# Patient Record
Sex: Male | Born: 1955 | Race: Black or African American | Hispanic: No | Marital: Single | Smoking: Current every day smoker
Health system: Southern US, Community
[De-identification: ages and names within clinical notes are randomized; demographics above are authoritative.]

## PROBLEM LIST (undated history)

## (undated) DIAGNOSIS — E43 Unspecified severe protein-calorie malnutrition: Secondary | ICD-10-CM

## (undated) DIAGNOSIS — F209 Schizophrenia, unspecified: Secondary | ICD-10-CM

## (undated) DIAGNOSIS — R778 Other specified abnormalities of plasma proteins: Secondary | ICD-10-CM

## (undated) DIAGNOSIS — I272 Pulmonary hypertension, unspecified: Secondary | ICD-10-CM

## (undated) DIAGNOSIS — R7989 Other specified abnormal findings of blood chemistry: Secondary | ICD-10-CM

## (undated) DIAGNOSIS — W19XXXA Unspecified fall, initial encounter: Secondary | ICD-10-CM

## (undated) DIAGNOSIS — I4891 Unspecified atrial fibrillation: Secondary | ICD-10-CM

## (undated) DIAGNOSIS — E785 Hyperlipidemia, unspecified: Secondary | ICD-10-CM

## (undated) DIAGNOSIS — I519 Heart disease, unspecified: Secondary | ICD-10-CM

## (undated) DIAGNOSIS — I10 Essential (primary) hypertension: Secondary | ICD-10-CM

## (undated) DIAGNOSIS — N529 Male erectile dysfunction, unspecified: Secondary | ICD-10-CM

## (undated) DIAGNOSIS — R519 Headache, unspecified: Secondary | ICD-10-CM

## (undated) DIAGNOSIS — I5189 Other ill-defined heart diseases: Secondary | ICD-10-CM

## (undated) DIAGNOSIS — C61 Malignant neoplasm of prostate: Secondary | ICD-10-CM

## (undated) DIAGNOSIS — K219 Gastro-esophageal reflux disease without esophagitis: Secondary | ICD-10-CM

## (undated) DIAGNOSIS — N179 Acute kidney failure, unspecified: Secondary | ICD-10-CM

## (undated) DIAGNOSIS — R079 Chest pain, unspecified: Secondary | ICD-10-CM

## (undated) DIAGNOSIS — Y92009 Unspecified place in unspecified non-institutional (private) residence as the place of occurrence of the external cause: Secondary | ICD-10-CM

## (undated) DIAGNOSIS — S7291XA Unspecified fracture of right femur, initial encounter for closed fracture: Secondary | ICD-10-CM

## (undated) DIAGNOSIS — R51 Headache: Secondary | ICD-10-CM

## (undated) HISTORY — DX: Acute kidney failure, unspecified: N17.9

## (undated) HISTORY — DX: Schizophrenia, unspecified: F20.9

## (undated) HISTORY — DX: Hyperlipidemia, unspecified: E78.5

## (undated) HISTORY — DX: Unspecified fall, initial encounter: W19.XXXA

## (undated) HISTORY — DX: Other ill-defined heart diseases: I51.89

## (undated) HISTORY — DX: Pulmonary hypertension, unspecified: I27.20

## (undated) HISTORY — DX: Heart disease, unspecified: I51.9

## (undated) HISTORY — DX: Gastro-esophageal reflux disease without esophagitis: K21.9

## (undated) HISTORY — DX: Headache: R51

## (undated) HISTORY — DX: Unspecified fracture of right femur, initial encounter for closed fracture: S72.91XA

## (undated) HISTORY — DX: Unspecified atrial fibrillation: I48.91

## (undated) HISTORY — DX: Headache, unspecified: R51.9

## (undated) HISTORY — DX: Male erectile dysfunction, unspecified: N52.9

## (undated) HISTORY — DX: Other specified abnormal findings of blood chemistry: R79.89

## (undated) HISTORY — DX: Essential (primary) hypertension: I10

## (undated) HISTORY — DX: Other specified abnormalities of plasma proteins: R77.8

## (undated) HISTORY — DX: Chest pain, unspecified: R07.9

## (undated) HISTORY — DX: Unspecified severe protein-calorie malnutrition: E43

## (undated) HISTORY — DX: Unspecified place in unspecified non-institutional (private) residence as the place of occurrence of the external cause: Y92.009

## (undated) HISTORY — DX: Malignant neoplasm of prostate: C61

---

## 2007-11-30 ENCOUNTER — Ambulatory Visit: Payer: Self-pay | Admitting: Radiation Oncology

## 2007-12-22 ENCOUNTER — Ambulatory Visit: Payer: Self-pay | Admitting: Specialist

## 2007-12-23 ENCOUNTER — Ambulatory Visit: Payer: Self-pay | Admitting: Specialist

## 2007-12-30 ENCOUNTER — Ambulatory Visit: Payer: Self-pay | Admitting: Radiation Oncology

## 2008-01-11 ENCOUNTER — Ambulatory Visit: Payer: Self-pay | Admitting: Radiation Oncology

## 2008-01-30 ENCOUNTER — Ambulatory Visit: Payer: Self-pay | Admitting: Radiation Oncology

## 2008-02-19 ENCOUNTER — Ambulatory Visit: Payer: Self-pay | Admitting: Radiation Oncology

## 2008-03-01 ENCOUNTER — Ambulatory Visit: Payer: Self-pay | Admitting: Radiation Oncology

## 2008-03-31 ENCOUNTER — Ambulatory Visit: Payer: Self-pay | Admitting: Radiation Oncology

## 2008-05-01 ENCOUNTER — Ambulatory Visit: Payer: Self-pay | Admitting: Radiation Oncology

## 2009-01-05 ENCOUNTER — Emergency Department: Payer: Self-pay | Admitting: Emergency Medicine

## 2009-03-01 ENCOUNTER — Ambulatory Visit: Payer: Self-pay | Admitting: Internal Medicine

## 2009-03-29 ENCOUNTER — Ambulatory Visit: Payer: Self-pay | Admitting: Radiation Oncology

## 2009-03-31 ENCOUNTER — Ambulatory Visit: Payer: Self-pay | Admitting: Internal Medicine

## 2009-03-31 ENCOUNTER — Ambulatory Visit: Payer: Self-pay | Admitting: Radiation Oncology

## 2009-09-29 ENCOUNTER — Ambulatory Visit: Payer: Self-pay | Admitting: Radiation Oncology

## 2009-10-02 ENCOUNTER — Ambulatory Visit: Payer: Self-pay | Admitting: Radiation Oncology

## 2009-10-29 ENCOUNTER — Ambulatory Visit: Payer: Self-pay | Admitting: Radiation Oncology

## 2010-03-27 ENCOUNTER — Ambulatory Visit: Payer: Self-pay | Admitting: Family Medicine

## 2010-03-31 ENCOUNTER — Ambulatory Visit: Payer: Self-pay | Admitting: Radiation Oncology

## 2010-04-04 ENCOUNTER — Ambulatory Visit: Payer: Self-pay | Admitting: Radiation Oncology

## 2010-05-01 ENCOUNTER — Ambulatory Visit: Payer: Self-pay | Admitting: Radiation Oncology

## 2011-04-18 ENCOUNTER — Ambulatory Visit: Payer: Self-pay | Admitting: Radiation Oncology

## 2011-04-19 LAB — PSA: PSA: 2.5 ng/mL (ref 0.0–4.0)

## 2011-05-02 ENCOUNTER — Ambulatory Visit: Payer: Self-pay | Admitting: Radiation Oncology

## 2012-04-14 ENCOUNTER — Ambulatory Visit: Payer: Self-pay | Admitting: Radiation Oncology

## 2012-04-15 LAB — PSA: PSA: 4.5 ng/mL — ABNORMAL HIGH (ref 0.0–4.0)

## 2012-05-01 ENCOUNTER — Ambulatory Visit: Payer: Self-pay | Admitting: Radiation Oncology

## 2012-07-03 ENCOUNTER — Ambulatory Visit: Payer: Self-pay | Admitting: Family Medicine

## 2012-10-12 ENCOUNTER — Ambulatory Visit: Payer: Self-pay | Admitting: Radiation Oncology

## 2012-10-14 LAB — PSA: PSA: 2.7 ng/mL (ref 0.0–4.0)

## 2012-10-29 ENCOUNTER — Ambulatory Visit: Payer: Self-pay | Admitting: Radiation Oncology

## 2012-12-08 ENCOUNTER — Ambulatory Visit: Payer: Self-pay | Admitting: Family Medicine

## 2013-04-13 ENCOUNTER — Ambulatory Visit: Payer: Self-pay | Admitting: Radiation Oncology

## 2013-04-14 LAB — PSA: PSA: 4 ng/mL (ref 0.0–4.0)

## 2013-05-01 ENCOUNTER — Ambulatory Visit: Payer: Self-pay | Admitting: Radiation Oncology

## 2013-05-24 ENCOUNTER — Encounter: Payer: Self-pay | Admitting: Podiatry

## 2013-05-26 ENCOUNTER — Ambulatory Visit (INDEPENDENT_AMBULATORY_CARE_PROVIDER_SITE_OTHER): Payer: Medicaid Other | Admitting: Podiatry

## 2013-05-26 ENCOUNTER — Encounter: Payer: Self-pay | Admitting: Podiatry

## 2013-05-26 VITALS — BP 155/90 | HR 77 | Resp 16

## 2013-05-26 DIAGNOSIS — B351 Tinea unguium: Secondary | ICD-10-CM

## 2013-05-26 DIAGNOSIS — M79609 Pain in unspecified limb: Secondary | ICD-10-CM

## 2013-05-26 NOTE — Progress Notes (Signed)
Christopher Martinez presents today with a chief complaint of painful toenails one through 5 bilateral.  Objective: Nails are thick yellow dystrophic clinically mycotic painful 1 through 5 bilateral. Vascular evaluation demonstrates strong palpable pulses bilateral.  Assessment: Pain in limb secondary to onychomycosis 1 through 5 bilateral.  Plan: Debridement of nails in thickness and length as a covered service secondary to pain.

## 2013-08-25 ENCOUNTER — Ambulatory Visit (INDEPENDENT_AMBULATORY_CARE_PROVIDER_SITE_OTHER): Payer: Medicaid Other | Admitting: Podiatry

## 2013-08-25 VITALS — BP 136/104 | HR 140 | Resp 16 | Ht 64.0 in

## 2013-08-25 DIAGNOSIS — B351 Tinea unguium: Secondary | ICD-10-CM

## 2013-08-25 DIAGNOSIS — M79609 Pain in unspecified limb: Secondary | ICD-10-CM

## 2013-08-26 NOTE — Progress Notes (Signed)
Christopher Martinez presents today with a chief complaint of painfully elongated toenails.  Objective: Vital signs are stable alert oriented x3. Pulses are palpable bilateral. Nails are thick yellow dystrophic clinically mycotic and painful palpation.  Assessment: Pain in limb secondary to onychomycosis 1 through 5 bilateral.  Plan: Treatment of nails 1 through 5 bilateral is a covered service secondary to pain.

## 2013-10-12 ENCOUNTER — Ambulatory Visit: Payer: Self-pay | Admitting: Radiation Oncology

## 2013-10-14 LAB — PSA: PSA: 4.5 ng/mL — ABNORMAL HIGH (ref 0.0–4.0)

## 2013-10-29 ENCOUNTER — Ambulatory Visit: Payer: Self-pay | Admitting: Radiation Oncology

## 2013-11-16 ENCOUNTER — Ambulatory Visit: Payer: Self-pay | Admitting: Family Medicine

## 2013-11-17 ENCOUNTER — Ambulatory Visit: Payer: Medicaid Other | Admitting: Podiatry

## 2013-12-30 ENCOUNTER — Ambulatory Visit: Payer: Medicaid Other | Admitting: Podiatrist

## 2013-12-30 VITALS — BP 128/82 | HR 143 | Resp 16

## 2013-12-30 DIAGNOSIS — B351 Tinea unguium: Secondary | ICD-10-CM | POA: Diagnosis not present

## 2013-12-30 DIAGNOSIS — M79673 Pain in unspecified foot: Secondary | ICD-10-CM

## 2013-12-30 DIAGNOSIS — M79609 Pain in unspecified limb: Secondary | ICD-10-CM

## 2013-12-30 NOTE — Progress Notes (Signed)
HPI:  Patient presents today for follow up of foot and nail care. Denies any new complaints today.  Objective:  Patients chart is reviewed.  Vascular status reveals pedal pulses noted at 2 out of 4 dp and pt bilateral .  Neurological sensation is Normal to [employer] monofilament bilateral.  Patients nails are thickened, discolored, distrophic, friable and brittle with yellow-brown discoloration. Patient subjectively relates they are painful with shoes and with ambulation of bilateral feet.  hpk's submet 1 and 5 are noted bilateral.  Left submet 5 is less callused and thin and was not debrided today.   Assessment:  Symptomatic onychomycosis  Plan:  Discussed treatment options and alternatives.  The symptomatic toenails  And calluses were debrided through manual an mechanical means without complication.  Return appointment recommended at routine intervals of 3 months    Trudie Buckler, DPM

## 2014-02-09 ENCOUNTER — Ambulatory Visit: Payer: Medicaid Other | Admitting: Podiatry

## 2014-02-15 DIAGNOSIS — J441 Chronic obstructive pulmonary disease with (acute) exacerbation: Secondary | ICD-10-CM | POA: Insufficient documentation

## 2014-02-15 DIAGNOSIS — I499 Cardiac arrhythmia, unspecified: Secondary | ICD-10-CM | POA: Insufficient documentation

## 2014-02-15 DIAGNOSIS — I4891 Unspecified atrial fibrillation: Secondary | ICD-10-CM | POA: Insufficient documentation

## 2014-02-15 DIAGNOSIS — I110 Hypertensive heart disease with heart failure: Secondary | ICD-10-CM | POA: Insufficient documentation

## 2014-02-15 DIAGNOSIS — I459 Conduction disorder, unspecified: Secondary | ICD-10-CM | POA: Insufficient documentation

## 2014-02-15 DIAGNOSIS — R9431 Abnormal electrocardiogram [ECG] [EKG]: Secondary | ICD-10-CM | POA: Insufficient documentation

## 2014-02-15 DIAGNOSIS — I130 Hypertensive heart and chronic kidney disease with heart failure and stage 1 through stage 4 chronic kidney disease, or unspecified chronic kidney disease: Secondary | ICD-10-CM | POA: Insufficient documentation

## 2014-04-12 ENCOUNTER — Ambulatory Visit: Payer: Self-pay | Admitting: Radiation Oncology

## 2014-04-13 ENCOUNTER — Ambulatory Visit: Payer: Medicaid Other | Admitting: Podiatry

## 2014-04-19 LAB — PSA: PSA: 5.5 ng/mL — ABNORMAL HIGH (ref 0.0–4.0)

## 2014-04-27 ENCOUNTER — Ambulatory Visit: Payer: Self-pay | Admitting: Internal Medicine

## 2014-05-01 ENCOUNTER — Ambulatory Visit: Payer: Self-pay | Admitting: Radiation Oncology

## 2014-05-03 ENCOUNTER — Ambulatory Visit (INDEPENDENT_AMBULATORY_CARE_PROVIDER_SITE_OTHER): Payer: Medicaid Other | Admitting: Podiatry

## 2014-05-03 DIAGNOSIS — Q828 Other specified congenital malformations of skin: Secondary | ICD-10-CM

## 2014-05-03 DIAGNOSIS — M79673 Pain in unspecified foot: Secondary | ICD-10-CM

## 2014-05-03 DIAGNOSIS — B351 Tinea unguium: Secondary | ICD-10-CM

## 2014-05-03 NOTE — Progress Notes (Signed)
Patient ID: Christopher Martinez, male   DOB: 1955/08/18, 58 y.o.   MRN: [id number]  Subjective: 58 year old male returns the office they for follow-up evaluation of painful elongated nails and symptomatically hyperkeratotic lesions bilateral feet. He denies any redness or drainage from the nails. Denies any acute changes his last appointment. No other complaints at this time. Denies any systemic complaints such as fevers, chills, nausea, vomiting.  Objective: AAO 3, NAD DP/PT pulses palpable bilaterally, CRT less than 3 seconds Protective sensation intact with [employer] monofilament Nails hypertrophic, dystrophic, elongated, brittle, yellow-brown discoloration 10. No surrounding erythema or drainage. Hyperkeratotic lesion submetatarsal one and five bilaterally.  No calf pain with compression, swelling, warmth, erythema No open lesions  Assessment: 58 year old male with symptoms, onychomycosis, painful  Porokeratosis.  Plan: -Treatment options discussed including alternatives, risks, competitions. -Nail sharply debrided 10 without competitions -Hyperkeratotic lesion sharply debrided 4 without complications. -Discussed the importance of daily foot inspection -Follow-up in 3 months or sooner if any problems are to arise. In the meantime call the office with any questions, concerns, change in symptoms.

## 2014-05-04 ENCOUNTER — Ambulatory Visit: Payer: Medicaid Other | Admitting: Podiatry

## 2014-07-26 ENCOUNTER — Inpatient Hospital Stay: Payer: Self-pay | Admitting: Internal Medicine

## 2014-07-26 DIAGNOSIS — I340 Nonrheumatic mitral (valve) insufficiency: Secondary | ICD-10-CM

## 2014-07-26 LAB — URINALYSIS, COMPLETE
Bilirubin,UR: NEGATIVE
Glucose,UR: NEGATIVE mg/dL (ref 0–75)
Hyaline Cast: 2
KETONE: NEGATIVE
Nitrite: POSITIVE
PH: 5 (ref 4.5–8.0)
RBC,UR: 4 /HPF (ref 0–5)
Specific Gravity: 1.017 (ref 1.003–1.030)
Squamous Epithelial: <1
WBC UR: 36 /HPF (ref 0–5)

## 2014-07-26 LAB — COMPREHENSIVE METABOLIC PANEL
ALBUMIN: 3.5 g/dL (ref 3.4–5.0)
ALT: 51 U/L (ref 14–63)
Alkaline Phosphatase: 99 U/L (ref 46–116)
Anion Gap: 13 (ref 7–16)
BUN: 53 mg/dL — AB (ref 7–18)
Bilirubin,Total: 0.8 mg/dL (ref 0.2–1.0)
Calcium, Total: 9.1 mg/dL (ref 8.5–10.1)
Chloride: 103 mmol/L (ref 98–107)
Co2: 24 mmol/L (ref 21–32)
Creatinine: 1.57 mg/dL — ABNORMAL HIGH (ref 0.60–1.30)
EGFR (African American): 58 — ABNORMAL LOW
EGFR (Non-African Amer.): 48 — ABNORMAL LOW
GLUCOSE: 78 mg/dL (ref 65–99)
Osmolality: 293 (ref 275–301)
Potassium: 4.3 mmol/L (ref 3.5–5.1)
SGOT(AST): 39 U/L — ABNORMAL HIGH (ref 15–37)
Sodium: 140 mmol/L (ref 136–145)
Total Protein: 8.5 g/dL — ABNORMAL HIGH (ref 6.4–8.2)

## 2014-07-26 LAB — CBC WITH DIFFERENTIAL/PLATELET
BASOS PCT: 0.5 %
Basophil #: 0.1 10*3/uL (ref 0.0–0.1)
EOS PCT: 0.5 %
Eosinophil #: 0.1 10*3/uL (ref 0.0–0.7)
HCT: 42.2 % (ref 40.0–52.0)
HGB: 13.7 g/dL (ref 13.0–18.0)
LYMPHS ABS: 2.5 10*3/uL (ref 1.0–3.6)
Lymphocyte %: 18.7 %
MCH: 30.8 pg (ref 26.0–34.0)
MCHC: 32.5 g/dL (ref 32.0–36.0)
MCV: 95 fL (ref 80–100)
MONO ABS: 0.5 x10 3/mm (ref 0.2–1.0)
Monocyte %: 4.2 %
NEUTROS ABS: 10 10*3/uL — AB (ref 1.4–6.5)
Neutrophil %: 76.1 %
Platelet: 95 10*3/uL — ABNORMAL LOW (ref 150–440)
RBC: 4.45 10*6/uL (ref 4.40–5.90)
RDW: 13.5 % (ref 11.5–14.5)
WBC: 13.1 10*3/uL — ABNORMAL HIGH (ref 3.8–10.6)

## 2014-07-26 LAB — PROTIME-INR
INR: 1
Prothrombin Time: 13.5 secs (ref 11.5–14.7)

## 2014-07-26 LAB — TROPONIN I
TROPONIN-I: 0.19 ng/mL — AB
Troponin-I: 0.23 ng/mL — ABNORMAL HIGH
Troponin-I: 0.18 ng/mL — ABNORMAL HIGH

## 2014-07-26 LAB — LIPASE, BLOOD: Lipase: 137 U/L (ref 73–393)

## 2014-07-26 LAB — MAGNESIUM: Magnesium: 2.6 mg/dL — ABNORMAL HIGH

## 2014-07-26 LAB — CK TOTAL AND CKMB (NOT AT ARMC)
CK, Total: 335 U/L — ABNORMAL HIGH (ref 39–308)
CK-MB: 12.6 ng/mL — AB (ref 0.5–3.6)

## 2014-07-26 LAB — APTT: Activated PTT: 35.1 secs (ref 23.6–35.9)

## 2014-07-27 LAB — BASIC METABOLIC PANEL
Anion Gap: 11 (ref 7–16)
BUN: 48 mg/dL — ABNORMAL HIGH (ref 7–18)
Calcium, Total: 7.8 mg/dL — ABNORMAL LOW (ref 8.5–10.1)
Chloride: 110 mmol/L — ABNORMAL HIGH (ref 98–107)
Co2: 23 mmol/L (ref 21–32)
Creatinine: 1.59 mg/dL — ABNORMAL HIGH (ref 0.60–1.30)
EGFR (African American): 57 — ABNORMAL LOW
EGFR (Non-African Amer.): 47 — ABNORMAL LOW
GLUCOSE: 94 mg/dL (ref 65–99)
Osmolality: 299 (ref 275–301)
POTASSIUM: 3.5 mmol/L (ref 3.5–5.1)
Sodium: 144 mmol/L (ref 136–145)

## 2014-07-27 LAB — CBC WITH DIFFERENTIAL/PLATELET
Basophil #: 0 10*3/uL (ref 0.0–0.1)
Basophil %: 0.2 %
EOS PCT: 1.7 %
Eosinophil #: 0.2 10*3/uL (ref 0.0–0.7)
HCT: 33.4 % — ABNORMAL LOW (ref 40.0–52.0)
HGB: 10.7 g/dL — AB (ref 13.0–18.0)
LYMPHS PCT: 16.3 %
Lymphocyte #: 2.2 10*3/uL (ref 1.0–3.6)
MCH: 30.5 pg (ref 26.0–34.0)
MCHC: 31.9 g/dL — ABNORMAL LOW (ref 32.0–36.0)
MCV: 95 fL (ref 80–100)
MONO ABS: 0.8 x10 3/mm (ref 0.2–1.0)
Monocyte %: 5.7 %
Neutrophil #: 10.3 10*3/uL — ABNORMAL HIGH (ref 1.4–6.5)
Neutrophil %: 76.1 %
PLATELETS: 76 10*3/uL — AB (ref 150–440)
RBC: 3.5 10*6/uL — ABNORMAL LOW (ref 4.40–5.90)
RDW: 13.8 % (ref 11.5–14.5)
WBC: 13.5 10*3/uL — AB (ref 3.8–10.6)

## 2014-07-28 LAB — CBC WITH DIFFERENTIAL/PLATELET
Basophil #: 0 10*3/uL (ref 0.0–0.1)
Basophil %: 0.2 %
EOS ABS: 0.4 10*3/uL (ref 0.0–0.7)
Eosinophil %: 3.9 %
HCT: 33.3 % — AB (ref 40.0–52.0)
HGB: 10.9 g/dL — ABNORMAL LOW (ref 13.0–18.0)
LYMPHS PCT: 22.2 %
Lymphocyte #: 2.1 10*3/uL (ref 1.0–3.6)
MCH: 31.3 pg (ref 26.0–34.0)
MCHC: 32.6 g/dL (ref 32.0–36.0)
MCV: 96 fL (ref 80–100)
Monocyte #: 0.7 x10 3/mm (ref 0.2–1.0)
Monocyte %: 7 %
NEUTROS ABS: 6.4 10*3/uL (ref 1.4–6.5)
Neutrophil %: 66.7 %
Platelet: 95 10*3/uL — ABNORMAL LOW (ref 150–440)
RBC: 3.47 10*6/uL — ABNORMAL LOW (ref 4.40–5.90)
RDW: 13.8 % (ref 11.5–14.5)
WBC: 9.6 10*3/uL (ref 3.8–10.6)

## 2014-07-28 LAB — BASIC METABOLIC PANEL
Anion Gap: 9 (ref 7–16)
BUN: 37 mg/dL — ABNORMAL HIGH (ref 7–18)
CO2: 22 mmol/L (ref 21–32)
Calcium, Total: 8.2 mg/dL — ABNORMAL LOW (ref 8.5–10.1)
Chloride: 111 mmol/L — ABNORMAL HIGH (ref 98–107)
Creatinine: 1.5 mg/dL — ABNORMAL HIGH (ref 0.60–1.30)
EGFR (African American): >60
EGFR (Non-African Amer.): 51 — ABNORMAL LOW
GLUCOSE: 87 mg/dL (ref 65–99)
Osmolality: 291 (ref 275–301)
Potassium: 3.8 mmol/L (ref 3.5–5.1)
SODIUM: 142 mmol/L (ref 136–145)

## 2014-07-29 HISTORY — PX: OTHER SURGICAL HISTORY: SHX169

## 2014-07-29 LAB — BASIC METABOLIC PANEL
Anion Gap: 7 (ref 7–16)
BUN: 30 mg/dL — AB (ref 7–18)
CALCIUM: 8.4 mg/dL — AB (ref 8.5–10.1)
CHLORIDE: 111 mmol/L — AB (ref 98–107)
Co2: 25 mmol/L (ref 21–32)
Creatinine: 1.42 mg/dL — ABNORMAL HIGH (ref 0.60–1.30)
EGFR (African American): >60
EGFR (Non-African Amer.): 54 — ABNORMAL LOW
GLUCOSE: 99 mg/dL (ref 65–99)
OSMOLALITY: 291 (ref 275–301)
POTASSIUM: 4.2 mmol/L (ref 3.5–5.1)
Sodium: 143 mmol/L (ref 136–145)

## 2014-07-30 LAB — BASIC METABOLIC PANEL
ANION GAP: 5 — AB (ref 7–16)
BUN: 21 mg/dL — ABNORMAL HIGH (ref 7–18)
CO2: 27 mmol/L (ref 21–32)
CREATININE: 1.26 mg/dL (ref 0.60–1.30)
Calcium, Total: 8.4 mg/dL — ABNORMAL LOW (ref 8.5–10.1)
Chloride: 107 mmol/L (ref 98–107)
EGFR (Non-African Amer.): >60
Glucose: 99 mg/dL (ref 65–99)
OSMOLALITY: 281 (ref 275–301)
Potassium: 4.7 mmol/L (ref 3.5–5.1)
Sodium: 139 mmol/L (ref 136–145)

## 2014-07-30 LAB — HEMOGLOBIN: HGB: 9.7 g/dL — AB (ref 13.0–18.0)

## 2014-07-30 LAB — PLATELET COUNT: Platelet: 137 10*3/uL — ABNORMAL LOW (ref 150–440)

## 2014-07-31 LAB — HEMOGLOBIN: HGB: 9.5 g/dL — AB (ref 13.0–18.0)

## 2014-08-01 LAB — CLOSTRIDIUM DIFFICILE(ARMC)

## 2014-08-02 ENCOUNTER — Ambulatory Visit: Payer: Medicaid Other | Admitting: Podiatry

## 2014-08-05 ENCOUNTER — Encounter: Payer: Self-pay | Admitting: Adult Health

## 2014-08-05 ENCOUNTER — Non-Acute Institutional Stay (SKILLED_NURSING_FACILITY): Payer: Medicaid Other | Admitting: Adult Health

## 2014-08-05 DIAGNOSIS — E43 Unspecified severe protein-calorie malnutrition: Secondary | ICD-10-CM | POA: Insufficient documentation

## 2014-08-05 DIAGNOSIS — I480 Paroxysmal atrial fibrillation: Secondary | ICD-10-CM

## 2014-08-05 DIAGNOSIS — I4891 Unspecified atrial fibrillation: Secondary | ICD-10-CM | POA: Insufficient documentation

## 2014-08-05 DIAGNOSIS — K219 Gastro-esophageal reflux disease without esophagitis: Secondary | ICD-10-CM | POA: Insufficient documentation

## 2014-08-05 DIAGNOSIS — S72401A Unspecified fracture of lower end of right femur, initial encounter for closed fracture: Secondary | ICD-10-CM | POA: Insufficient documentation

## 2014-08-05 DIAGNOSIS — S72401S Unspecified fracture of lower end of right femur, sequela: Secondary | ICD-10-CM

## 2014-08-05 MED ORDER — METOPROLOL TARTRATE 50 MG PO TABS: 75.0000 mg | ORAL_TABLET | Freq: Two times a day (BID) | ORAL | Status: DC

## 2014-08-05 NOTE — Progress Notes (Addendum)
Patient ID: Christopher Martinez, male   DOB: 22-May-1956, 59 y.o.   MRN: [id number]  Christopher Martinez living [facility]     No Known Allergies     Chief Complaint  Patient presents with  . Hospitalization Follow-up    HPI:  He has been hospitalized after a fall at home; afterward he lay on the floor for several days before he was found by his Education officer, museum. He has a diagnosis of schizophrenia; however; per his psychological consult he has never been treated for this disease and denies having it. He has not been declared incompetent. He did suffer a right femoral fracture had an orif of the right femur. He is here for short term rehab. He states his goal is to return back home.    Past Medical History  Diagnosis Date  . Frequent headaches   . Chest pain   . Fall at home   . Femur fracture, right   . Hypertension   . Elevated troponin   . GERD (gastroesophageal reflux disease)   . Acute renal failure   . Mild left ventricular systolic dysfunction   . Pulmonary hypertension   . A-fib   . Prostate cancer   . Schizophrenia   . Hyperlipidemia   . Severe protein-calorie malnutrition     Past Surgical History  Procedure Laterality Date  . Orif right femur Right 07-29-2014    VITAL SIGNS BP 121/82 mmHg  Pulse 120  Ht 5\' 6"  (1.676 m)  Wt 103 lb (46.72 kg)  BMI 16.63 kg/m2   Outpatient Encounter Prescriptions as of 08/05/2014  Medication Sig  . aspirin 81 MG tablet Take 81 mg by mouth daily.  [name] diltiazem (DILACOR XR) 240 MG 24 hr capsule Take 240 mg by mouth daily.  [name] enoxaparin (LOVENOX) 40 MG/0.4ML injection Inject 40 mg into the skin daily.  . magnesium hydroxide (MILK OF MAGNESIA) 800 MG/5ML suspension Take 30 mLs by mouth every 6 (six) hours as needed for constipation.  . metoprolol (LOPRESSOR) 50 MG tablet Take 50 mg by mouth 2 (two) times daily.   Oxycodone 5 mg  5 mg or 10 mg every 4 hours as needed   . ranitidine (ZANTAC) 150 MG tablet Take 150 mg by mouth 2 (two) times daily.      SIGNIFICANT DIAGNOSTIC EXAMS  07-26-14: right knee x-ray: comminuted displaced intra-articular distal right femoral metaphyseal fracture.  07-26-14: chest x-ray: no acute cardiopulmonary disease   07-29-14: right knee: status post intraoperative repair of distal right femoral fracture. A lateral metallic fixation place and screws are noted in distal right femur. There is improvement in alignment    LABS REVIEWED:   07-26-14: wbc 13.1; hgb 13.7; hct 42.2; plt 95; glucose75; bun 53; creat 1.5; k+4.3; na++140; mag 2.6; liver normal ck 335; ck-mb 12.6; troponin 0.23.     Review of Systems  Constitutional: Negative for weight loss and malaise/fatigue.  Respiratory: Negative for cough and shortness of breath.   Cardiovascular: Negative for chest pain, palpitations and leg swelling.  Gastrointestinal: Negative for heartburn, abdominal pain and constipation.  Genitourinary: Negative for dysuria, frequency and hematuria.  Musculoskeletal: Positive for joint pain. Negative for myalgias.       Has right leg pain due to fracture; states is getting adequate pain relief.   Skin: Negative.   Neurological: Negative for headaches.  Psychiatric/Behavioral: Negative for depression. The patient is not nervous/anxious.      Physical Exam  Constitutional: He is oriented to person, place, and time. No  distress.  Frail   Neck: Neck supple. No JVD present. No thyromegaly present.  Cardiovascular: Regular rhythm, normal heart sounds and intact distal pulses.   Is tachycardic   Respiratory: Effort normal and breath sounds normal. No respiratory distress.  GI: Soft. Bowel sounds are normal. He exhibits no distension. There is no tenderness.  Genitourinary:  Urine is "white" in color states urine has been this color for a long time   Musculoskeletal: He exhibits no edema.  Is able to move all extremities is status post right femoral fracture.   Neurological: He is alert and oriented to person,  place, and time.  Skin: Skin is warm and dry. He is not diaphoretic.  Incision lines without signs of infection present.        ASSESSMENT/ PLAN:  1.  Right femoral fracture; will follow up with orthopedics as indicated; will continue with therapy as directed; will continue lovenox for a total of 14 days; will continue oxycodone 5 or 10 mg every 4 hours as needed for pain and will monitor his status.   2. Afib; his heart rate remains tachycardic; will continue asa 81 mg daily; will increase his lopressor to 75 mg twice daily will continue diltiazem xr 240 mg daily and will monitor his vital signs every shift will monitor his status  3. Gerd: will continue zantac 150 mg twice daily   4. Malnutrition: will continue supplements per facility protocol and will begin mvi daily will monitor his weight.   Will check cbc; cmp ua/c&s   Time spent 50 minutes with patient     Ok Edwards NP [facility]  Contact [phone] Monday through Friday 8am- 5pm  After hours call [phone]

## 2014-08-06 LAB — BASIC METABOLIC PANEL
BUN: 16 mg/dL (ref 4–21)
Creatinine: 1.3 mg/dL (ref 0.6–1.3)
Glucose: 103 mg/dL
Potassium: 5.2 mmol/L (ref 3.4–5.3)
SODIUM: 136 mmol/L — AB (ref 137–147)

## 2014-08-09 ENCOUNTER — Non-Acute Institutional Stay (SKILLED_NURSING_FACILITY): Payer: Medicaid Other | Admitting: Internal Medicine

## 2014-08-09 ENCOUNTER — Encounter: Payer: Self-pay | Admitting: Internal Medicine

## 2014-08-09 DIAGNOSIS — I270 Primary pulmonary hypertension: Secondary | ICD-10-CM

## 2014-08-09 DIAGNOSIS — F209 Schizophrenia, unspecified: Secondary | ICD-10-CM

## 2014-08-09 DIAGNOSIS — I272 Pulmonary hypertension, unspecified: Secondary | ICD-10-CM

## 2014-08-09 DIAGNOSIS — E43 Unspecified severe protein-calorie malnutrition: Secondary | ICD-10-CM

## 2014-08-09 DIAGNOSIS — I519 Heart disease, unspecified: Secondary | ICD-10-CM

## 2014-08-09 DIAGNOSIS — N179 Acute kidney failure, unspecified: Secondary | ICD-10-CM | POA: Insufficient documentation

## 2014-08-09 DIAGNOSIS — I5189 Other ill-defined heart diseases: Secondary | ICD-10-CM

## 2014-08-09 DIAGNOSIS — Z8546 Personal history of malignant neoplasm of prostate: Secondary | ICD-10-CM

## 2014-08-09 DIAGNOSIS — S72401S Unspecified fracture of lower end of right femur, sequela: Secondary | ICD-10-CM

## 2014-08-09 DIAGNOSIS — K219 Gastro-esophageal reflux disease without esophagitis: Secondary | ICD-10-CM

## 2014-08-09 DIAGNOSIS — I480 Paroxysmal atrial fibrillation: Secondary | ICD-10-CM

## 2014-08-09 DIAGNOSIS — I071 Rheumatic tricuspid insufficiency: Secondary | ICD-10-CM

## 2014-08-09 DIAGNOSIS — I10 Essential (primary) hypertension: Secondary | ICD-10-CM

## 2014-08-09 NOTE — Progress Notes (Signed)
Patient ID: Deral Schellenberg, male   DOB: 02/22/1956, 59 y.o.   MRN: [id number]    HISTORY AND PHYSICAL  Location:  [facility]    Place of Service: SNF [phone])   Extended Emergency Contact Information Primary Emergency Contact: [place] [facility] [place] Phone: [phone] Relation: None  Advanced Directive information   FULL CODE  Chief Complaint  Patient presents with  . New Admit To SNF    HPI:  59 yo male seen today as a new admission into SNF for above.  Past Medical History  Diagnosis Date  . Frequent headaches   . Chest pain   . Fall at home   . Femur fracture, right   . Hypertension   . Elevated troponin   . GERD (gastroesophageal reflux disease)   . Acute renal failure   . Mild left ventricular systolic dysfunction   . Pulmonary hypertension   . A-fib   . Prostate cancer   . Schizophrenia   . Hyperlipidemia   . Severe protein-calorie malnutrition     Past Surgical History  Procedure Laterality Date  . Orif right femur Right 07-29-2014    Patient Care Team: Ashok Norris, MD as PCP - General (Family Medicine)  History   Social History  . Marital Status: Single    Spouse Name: N/A    Number of Children: N/A  . Years of Education: N/A   Occupational History  . Not on file.   Social History Main Topics  . Smoking status: Current Every Day Smoker -- 1.00 packs/day    Types: Cigarettes  . Smokeless tobacco: Not on file  . Alcohol Use: No  . Drug Use: No  . Sexual Activity: Not on file   Other Topics Concern  . Not on file   Social History Narrative     reports that he has been smoking Cigarettes.  He has been smoking about 1.00 pack per day. He does not have any smokeless tobacco history on file. He reports that he does not drink alcohol or use illicit drugs.  No family history on file. Family Status  Relation Status Death Age  . Mother Deceased   . Father Deceased      There is no immunization  history on file for this patient.  No Known Allergies  Medications: Patient's Medications  New Prescriptions   No medications on file  Previous Medications   ASPIRIN 81 MG TABLET    Take 81 mg by mouth daily.   DILTIAZEM (DILACOR XR) 240 MG 24 HR CAPSULE    Take 240 mg by mouth daily.   ENOXAPARIN (LOVENOX) 40 MG/0.4ML INJECTION    Inject 40 mg into the skin daily.   MAGNESIUM HYDROXIDE (MILK OF MAGNESIA) 800 MG/5ML SUSPENSION    Take 30 mLs by mouth every 6 (six) hours as needed for constipation.   METOPROLOL (LOPRESSOR) 50 MG TABLET    Take 1.5 tablets (75 mg total) by mouth 2 (two) times daily.   RANITIDINE (ZANTAC) 150 MG TABLET    Take 150 mg by mouth 2 (two) times daily.  Modified Medications   No medications on file  Discontinued Medications   No medications on file    Review of Systems  Filed Vitals:   08/08/14 1556  BP: 117/90  Pulse: 100  Temp: 98.4 F (36.9 C)  SpO2: 98%   There is no weight on file to calculate BMI.  Physical Exam  CONSTITUTIONAL: Looks well in NAD. Awake,  alert and oriented x 3 HEENT: PERRLA. Oropharynx clear and without exudate NECK: Supple. Nontender. No palpable cervical or supraclavicular lymph nodes. No carotid bruit b/l. No thyromegaly or thyroid mass palpable.  CVS: irregular with 1/6 SEmurmur. No gallop or rub. LUNGS: CTA b/l no wheezing, rales or rhonchi. ABDOMEN: Bowel sounds present x 4. Soft, nontender, nondistended. No palpable mass or bruit EXTREMITIES: trace RLE edema and no LLE edema. Distal pulses palpable. No calf tenderness. Right lateral thigh bandages c/d/i. No redness or d/c. Min TTP PSYCH: Affect, behavior and mood normal  Labs reviewed: No results found for any previous visit.  No results found.   Assessment/Plan

## 2014-08-23 ENCOUNTER — Encounter: Payer: Self-pay | Admitting: Internal Medicine

## 2014-08-23 ENCOUNTER — Non-Acute Institutional Stay (SKILLED_NURSING_FACILITY): Payer: Medicaid Other | Admitting: Internal Medicine

## 2014-08-23 DIAGNOSIS — I480 Paroxysmal atrial fibrillation: Secondary | ICD-10-CM

## 2014-08-23 DIAGNOSIS — K219 Gastro-esophageal reflux disease without esophagitis: Secondary | ICD-10-CM

## 2014-08-23 DIAGNOSIS — I272 Pulmonary hypertension, unspecified: Secondary | ICD-10-CM

## 2014-08-23 DIAGNOSIS — E43 Unspecified severe protein-calorie malnutrition: Secondary | ICD-10-CM

## 2014-08-23 DIAGNOSIS — F209 Schizophrenia, unspecified: Secondary | ICD-10-CM

## 2014-08-23 DIAGNOSIS — I071 Rheumatic tricuspid insufficiency: Secondary | ICD-10-CM

## 2014-08-23 DIAGNOSIS — I270 Primary pulmonary hypertension: Secondary | ICD-10-CM

## 2014-08-23 DIAGNOSIS — I10 Essential (primary) hypertension: Secondary | ICD-10-CM

## 2014-08-23 DIAGNOSIS — S72401S Unspecified fracture of lower end of right femur, sequela: Secondary | ICD-10-CM

## 2014-08-23 NOTE — Progress Notes (Signed)
Patient ID: Christopher Martinez, male   DOB: [birth year], 59 y.o.   MRN: [id number]    HISTORY AND PHYSICAL  Location:       Place of Service:     Extended Emergency Contact Information Primary Emergency Contact: [facility] Address: [place], [place] [address] Home Phone: [phone] Relation: None Secondary Emergency Contact: [place] [facility] [place] Phone: [phone] Relation: None  Advanced Directive information    Chief Complaint  Patient presents with  . New Admit To SNF    afib w RVR, HTN, right femur fx s/p repair, ARF due to dehydration, GERD, moderate TR, mild systolic dysfunction, mild pulmonary HTN, schizophrenia, hx prostate CA    HPI:  59 yo male seen today as a new admission into SNF for above. He c/o sternal CP with intermittent palpitations, DOE on [place] O2. He is s/p right hip ORIF. He is c/a tingling "all over" at night. He has sleep disturbances. Pain is severe most times. No recent falls. He is tolerating PT  Past Medical History  Diagnosis Date  . Frequent headaches   . Chest pain   . Fall at home   . Femur fracture, right   . Hypertension   . Elevated troponin   . GERD (gastroesophageal reflux disease)   . Acute renal failure   . Mild left ventricular systolic dysfunction   . Pulmonary hypertension   . A-fib   . Prostate cancer   . Schizophrenia   . Hyperlipidemia   . Severe protein-calorie malnutrition     Past Surgical History  Procedure Laterality Date  . Orif right femur Right 07-29-2014    Patient Care Team: Ashok Norris, MD as PCP - General (Family Medicine) Ashok Norris, MD (Family Medicine)  History   Social History  . Marital Status: Single    Spouse Name: N/A  . Number of Children: N/A  . Years of Education: N/A   Occupational History  . Not on file.   Social History Main Topics  . Smoking status: Current Every Day Smoker -- 1.00 packs/day    Types: Cigarettes  . Smokeless tobacco:  Not on file  . Alcohol Use: No  . Drug Use: No  . Sexual Activity: Not on file   Other Topics Concern  . Not on file   Social History Narrative     reports that he has been smoking Cigarettes.  He has been smoking about 1.00 pack per day. He does not have any smokeless tobacco history on file. He reports that he does not drink alcohol or use illicit drugs.  No family history on file. Family Status  Relation Status Death Age  . Mother Deceased   . Father Deceased      There is no immunization history on file for this patient.  Past medical, surgical, family and social history reviewed  No Known Allergies  Medications: Patient's Medications  New Prescriptions   No medications on file  Previous Medications   ASPIRIN 81 MG TABLET    Take 81 mg by mouth daily.   DILTIAZEM (DILACOR XR) 240 MG 24 HR CAPSULE    Take 240 mg by mouth daily.   ENOXAPARIN (LOVENOX) 40 MG/0.4ML INJECTION    Inject 40 mg into the skin daily.   MAGNESIUM HYDROXIDE (MILK OF MAGNESIA) 800 MG/5ML SUSPENSION    Take 30 mLs by mouth every 6 (six) hours as needed for constipation.   METOPROLOL (LOPRESSOR) 50  MG TABLET    Take 1.5 tablets (75 mg total) by mouth 2 (two) times daily.   NICOTINE (NICODERM CQ - DOSED IN MG/24 HOURS) 21 MG/24HR PATCH    Place 21 mg onto the skin daily.   OXYCODONE (OXY IR/ROXICODONE) 5 MG IMMEDIATE RELEASE TABLET    Take 5 mg by mouth every 4 (four) hours as needed for severe pain (Give 1 tab q4hrs prn moderate pain. May take 2 tabs q4h prn severe pain).   RANITIDINE (ZANTAC) 150 MG TABLET    Take 150 mg by mouth 2 (two) times daily.  Modified Medications   No medications on file  Discontinued Medications   No medications on file    Review of Systems As above. All other systems reviewed are negative  Filed Vitals:   08/08/14 1157  BP: 117/90  Pulse: 100  Temp: 98.4 F (36.9 C)  SpO2: 98%   There is no weight on file to calculate BMI.  Physical Exam  CONSTITUTIONAL:  Looks well in NAD. Awake, alert and oriented x 3 HEENT: PERRLA. Oropharynx clear and without exudate. MMM NECK: Supple. Nontender. No palpable cervical or supraclavicular lymph nodes. No carotid bruit b/l. No thyromegaly or thyroid mass palpable.  CVS: Irregular irregular with 1/6 SE murmur. No gallop or rub. LUNGS: CTA b/l no wheezing, rales or rhonchi. ABDOMEN: Bowel sounds present x 4. Soft, nontender, nondistended. No palpable mass or bruit EXTREMITIES: trace RLE edema with c/d/i lateral thigh and knee bandages. No LLE edema. Distal pulses palpable. No calf tenderness PSYCH: Affect, behavior and mood normal  Labs reviewed: No results found for any previous visit.  No results found.   Chart reviewed  Assessment/Plan   ICD-9-CM ICD-10-CM   1. Paroxysmal atrial fibrillation - rate controlled 427.31 I48.0   2. Closed fracture of right distal femur, sequela s/p repair 905.4 S72.401S   3. Essential hypertension controlled 401.9 I10   4. Severe protein-calorie malnutrition 262 E43   5. Gastroesophageal reflux disease without esophagitis 530.81 K21.9   6. Schizophrenia, unspecified type - stable 295.90 F20.9   7. Moderate tricuspid regurgitation 397.0 I07.1   8. Mild pulmonary hypertension 416.8 I27.0   9. Tobacco abuse - use nicotine patch daily   --continue pain control  --PT/OT/ST as indicated  --continue nutritional supplement TID  --check CBC w diff and CMP if not done already  --continue other medications as ordered  --will follow  GOAL: short term rehab and d/c home when medically appropriate. Communicated with pt and nursing.   Whitley Strycharz S. Perlie Gold  [facility] [address] [place], [place] [address] [phone] Office (Wednesdays and Fridays 8 AM - 5 PM) [phone] Cell (Monday-Friday 8 AM - 5 PM)

## 2014-09-21 ENCOUNTER — Encounter: Payer: Self-pay | Admitting: Vascular Surgery

## 2014-09-27 ENCOUNTER — Encounter: Payer: Self-pay | Admitting: Adult Health

## 2014-09-27 ENCOUNTER — Non-Acute Institutional Stay (INDEPENDENT_AMBULATORY_CARE_PROVIDER_SITE_OTHER): Payer: Medicaid Other | Admitting: Adult Health

## 2014-09-27 ENCOUNTER — Encounter: Payer: Self-pay | Admitting: Vascular Surgery

## 2014-09-27 DIAGNOSIS — S72401S Unspecified fracture of lower end of right femur, sequela: Secondary | ICD-10-CM

## 2014-09-27 DIAGNOSIS — E43 Unspecified severe protein-calorie malnutrition: Secondary | ICD-10-CM

## 2014-09-27 DIAGNOSIS — I480 Paroxysmal atrial fibrillation: Secondary | ICD-10-CM | POA: Diagnosis not present

## 2014-09-27 DIAGNOSIS — I10 Essential (primary) hypertension: Secondary | ICD-10-CM | POA: Diagnosis not present

## 2014-09-27 DIAGNOSIS — F209 Schizophrenia, unspecified: Secondary | ICD-10-CM | POA: Diagnosis not present

## 2014-09-27 NOTE — Progress Notes (Signed)
Patient ID: Christopher Martinez, male   DOB: 1955/10/05, 59 y.o.   MRN: [id number]  [facility]     No Known Allergies     Chief Complaint  Patient presents with  . Discharge Note    HPI:  He is being discharged to assisted living. He will need a wheelchair in order to maintain his current level of independence with ad's which cannot be achieved with a walker due to his right femur fracture. He will need a 3:1 commode as well. He will need home health in order to improve upon his gait; balance; and adl retraining. He will need his prescriptions to be written and will need a follow up with his pcp.    Past Medical History  Diagnosis Date  . Frequent headaches   . Chest pain   . Fall at home   . Femur fracture, right   . Hypertension   . Elevated troponin   . GERD (gastroesophageal reflux disease)   . Acute renal failure   . Mild left ventricular systolic dysfunction   . Pulmonary hypertension   . A-fib   . Prostate cancer   . Schizophrenia   . Hyperlipidemia   . Severe protein-calorie malnutrition     Past Surgical History  Procedure Laterality Date  . Orif right femur Right 07-29-2014    VITAL SIGNS BP 132/88 mmHg  Pulse 76  Ht 5\' 3"  (1.6 m)  Wt 103 lb (46.72 kg)  BMI 18.25 kg/m2  SpO2 98%   Outpatient Encounter Prescriptions as of 09/27/2014  Medication Sig  . aspirin 81 MG tablet Take 81 mg by mouth daily.  [name] diltiazem (DILACOR XR) 240 MG 24 hr capsule Take 240 mg by mouth daily.  . magnesium hydroxide (MILK OF MAGNESIA) 800 MG/5ML suspension Take 30 mLs by mouth every 6 (six) hours as needed for constipation.  . metoprolol (LOPRESSOR) 50 MG tablet Take 1.5 tablets (75 mg total) by mouth 2 (two) times daily.   Ativan 0.5 mg  Take 0.5 mg every 6 hours as needed for anxiety   . nicotine (NICODERM CQ - DOSED IN MG/24 HOURS) 21 mg/24hr patch Place 21 mg onto the skin daily.  [name] oxyCODONE (OXY IR/ROXICODONE) 5 MG immediate release tablet Take 5 mg by mouth every 4 (four)  hours as needed for severe pain (Give 1 tab q4hrs prn moderate pain. May take 2 tabs q4h prn severe pain).  . ranitidine (ZANTAC) 150 MG tablet Take 150 mg by mouth 2 (two) times daily.     SIGNIFICANT DIAGNOSTIC EXAMS  07-26-14: right knee x-ray: comminuted displaced intra-articular distal right femoral metaphyseal fracture.  07-26-14: chest x-ray: no acute cardiopulmonary disease   07-29-14: right knee: status post intraoperative repair of distal right femoral fracture. A lateral metallic fixation place and screws are noted in distal right femur. There is improvement in alignment  08-25-14: bilateral arterial lower extremity scan: severe plaque. Right common femoral artery stenosis and infrapopliteal arterial stenotic disease in the mild to moderate ischemia range. No doppler evidence of hemodynamically significant stenosis in the left femoral arterial system.   09-18-14: bilateral lower extremity venous doppler: no evidence of dvt in bilateral lower extremities.   LABS REVIEWED:   07-26-14: wbc 13.1; hgb 13.7; hct 42.2; plt 95; glucose75; bun 53; creat 1.5; k+4.3; na++140; mag 2.6; liver normal ck 335; ck-mb 12.6; troponin 0.23.  08-06-14: glucose 103; bun 22; creat 1.33; k+5.2; na++136; liver normal albumin 3.1  09-03-14: glucose 121; bun 36.3; creat 1.32; k+4.6;  na++137     ROS Constitutional: Negative for weight loss and malaise/fatigue.  Respiratory: Negative for cough and shortness of breath.   Cardiovascular: Negative for chest pain, palpitations and leg swelling.  Gastrointestinal: Negative for heartburn, abdominal pain and constipation.  Genitourinary: Negative for dysuria, frequency and hematuria.  Musculoskeletal: negative for joint pain . Negative for myalgias.    Skin: Negative.   Neurological: Negative for headaches.  Psychiatric/Behavioral: Negative for depression. The patient is not nervous/anxious.      Physical Exam Constitutional: He is oriented to person, place, and  time. No distress.  Frail   Neck: Neck supple. No JVD present. No thyromegaly present.  Cardiovascular: Regular rhythm, normal heart sounds and intact distal pulses.   Is tachycardic   Respiratory: Effort normal and breath sounds normal. No respiratory distress.  GI: Soft. Bowel sounds are normal. He exhibits no distension. There is no tenderness.   Musculoskeletal: He exhibits no edema.  Is able to move all extremities is status post right femoral fracture.   Neurological: He is alert and oriented to person, place, and time.  Skin: Skin is warm and dry. He is not diaphoretic.       ASSESSMENT/ PLAN:  Will discharge him to assisted living  with home health for pt/ot. He will need a standard wheelchair; 3:1 commode. His prescriptions have been written for a 30 day supply of his medications with #30 ativan 0.5 mg tabs and #30 oxycodone 5 mg tabs. He has a follow up with his pcp at [facility] on 10-05-14 at 9:00 am and with [facility] on 10-11-14 at 9;00 am.   Time spent with patient 45 minutes.     Ok Edwards NP [facility]  Contact [phone] Monday through Friday 8am- 5pm  After hours call [phone]

## 2014-09-28 ENCOUNTER — Encounter: Payer: Medicaid Other | Admitting: Vascular Surgery

## 2014-09-28 ENCOUNTER — Encounter: Payer: Self-pay | Admitting: Internal Medicine

## 2014-09-28 ENCOUNTER — Non-Acute Institutional Stay (SKILLED_NURSING_FACILITY): Payer: Medicaid Other | Admitting: Internal Medicine

## 2014-09-28 DIAGNOSIS — I480 Paroxysmal atrial fibrillation: Secondary | ICD-10-CM | POA: Diagnosis not present

## 2014-09-28 DIAGNOSIS — I10 Essential (primary) hypertension: Secondary | ICD-10-CM

## 2014-09-28 DIAGNOSIS — E875 Hyperkalemia: Secondary | ICD-10-CM | POA: Diagnosis not present

## 2014-09-28 DIAGNOSIS — S72401S Unspecified fracture of lower end of right femur, sequela: Secondary | ICD-10-CM

## 2014-09-28 DIAGNOSIS — K219 Gastro-esophageal reflux disease without esophagitis: Secondary | ICD-10-CM

## 2014-09-28 DIAGNOSIS — M25561 Pain in right knee: Secondary | ICD-10-CM

## 2014-09-28 DIAGNOSIS — F209 Schizophrenia, unspecified: Secondary | ICD-10-CM | POA: Diagnosis not present

## 2014-09-28 NOTE — Progress Notes (Signed)
Patient ID: Christopher Martinez, male   DOB: August 24, 1955, 59 y.o.   MRN: [id number]    Facility  [facility]    Place of Service:   SNF   No Known Allergies  Chief Complaint  Patient presents with  . Medical Management of Chronic Issues    HPI:  59 yo male seen today at SNF for f/u. C/o not eating well. No CP but has SOB. He has right knee pain 10/10 on scale. He is s/p femur fx repair. Pain med is oxycodone 5-10mg  q4hrs prn. He is tolerating PT/OT. No f/c. No falls. No nursing issues.  He has schizophrenia but takes no medications.  GERD is stable on zantac  afib controlled on metoprolol and diltiazem ER. He takes ASA daily for anticoagulation  He is still smoking cigs. Currently has nicotine patch.   Past Medical History  Diagnosis Date  . Frequent headaches   . Chest pain   . Fall at home   . Femur fracture, right   . Hypertension   . Elevated troponin   . GERD (gastroesophageal reflux disease)   . Acute renal failure   . Mild left ventricular systolic dysfunction   . Pulmonary hypertension   . A-fib   . Prostate cancer   . Schizophrenia   . Hyperlipidemia   . Severe protein-calorie malnutrition    Past Surgical History  Procedure Laterality Date  . Orif right femur Right 07-29-2014   History   Social History  . Marital Status: Single    Spouse Name: N/A  . Number of Children: N/A  . Years of Education: N/A   Social History Main Topics  . Smoking status: Current Every Day Smoker -- 1.00 packs/day    Types: Cigarettes  . Smokeless tobacco: Not on file  . Alcohol Use: No  . Drug Use: No  . Sexual Activity: Not on file   Other Topics Concern  . None   Social History Narrative     Medications: Patient's Medications  New Prescriptions   No medications on file  Previous Medications   ASPIRIN 81 MG TABLET    Take 81 mg by mouth daily.   DILTIAZEM (DILACOR XR) 240 MG 24 HR CAPSULE    Take 240 mg by mouth daily.   MAGNESIUM HYDROXIDE (MILK OF  MAGNESIA) 800 MG/5ML SUSPENSION    Take 30 mLs by mouth every 6 (six) hours as needed for constipation.   METOPROLOL (LOPRESSOR) 50 MG TABLET    Take 1.5 tablets (75 mg total) by mouth 2 (two) times daily.   NICOTINE (NICODERM CQ - DOSED IN MG/24 HOURS) 21 MG/24HR PATCH    Place 21 mg onto the skin daily.   OXYCODONE (OXY IR/ROXICODONE) 5 MG IMMEDIATE RELEASE TABLET    Take 5 mg by mouth every 4 (four) hours as needed for severe pain (Give 1 tab q4hrs prn moderate pain. May take 2 tabs q4h prn severe pain).   RANITIDINE (ZANTAC) 150 MG TABLET    Take 150 mg by mouth 2 (two) times daily.  Modified Medications   No medications on file  Discontinued Medications   No medications on file     Review of Systems  Constitutional: Positive for appetite change. Negative for chills, activity change and fatigue.  HENT: Negative for sore throat and trouble swallowing.   Eyes: Negative for visual disturbance.  Respiratory: Positive for shortness of breath. Negative for cough and chest tightness.   Cardiovascular: Negative for chest pain, palpitations and leg  swelling.  Gastrointestinal: Negative for nausea, vomiting, abdominal pain and blood in stool.  Genitourinary: Negative for urgency, frequency and difficulty urinating.  Musculoskeletal: Positive for joint swelling, arthralgias and gait problem.  Skin: Negative for rash.  Neurological: Negative for weakness and headaches.  Psychiatric/Behavioral: Positive for agitation. Negative for hallucinations, confusion and sleep disturbance. The patient is not nervous/anxious.     Filed Vitals:   09/01/14 0004  BP: 114/62  Pulse: 65  Temp: 97.8 F (36.6 C)  SpO2: 95%   There is no weight on file to calculate BMI.  Physical Exam  Constitutional: He is oriented to person, place, and time. He appears well-developed and well-nourished.  HENT:  Mouth/Throat: Oropharynx is clear and moist.  Eyes: Pupils are equal, round, and reactive to light. No scleral  icterus.  Neck: Neck supple. No thyromegaly present.  Cardiovascular: Normal rate, regular rhythm, normal heart sounds and intact distal pulses.  Exam reveals no gallop and no friction rub.   No murmur heard. No carotid bruit b/l; no distal LE swelling  Pulmonary/Chest: Effort normal and breath sounds normal. He has no wheezes. He has no rales. He exhibits no tenderness.  Abdominal: Soft. Bowel sounds are normal. He exhibits no distension, no abdominal bruit, no pulsatile midline mass and no mass. There is no tenderness. There is no rebound and no guarding.  Musculoskeletal: He exhibits edema (right lateral knee swelling; min TTP; reduced ROM) and tenderness.  Lymphadenopathy:    He has no cervical adenopathy.  Neurological: He is alert and oriented to person, place, and time.  Skin: Skin is warm and dry. No rash noted.  Psychiatric: His speech is normal and behavior is normal. Judgment and thought content normal. His mood appears anxious.     Labs reviewed: Nursing Home on 09/28/2014  Component Date Value Ref Range Status  . Glucose 08/06/2014 103   Final  . BUN 08/06/2014 16  4 - 21 mg/dL Final  . Creatinine 08/06/2014 1.3  0.6 - 1.3 mg/dL Final  . Potassium 08/06/2014 5.2  3.4 - 5.3 mmol/L Final  . Sodium 08/06/2014 136* 137 - 147 mmol/L Final     Assessment/Plan   ICD-9-CM ICD-10-CM   1. Right knee pain due to #2 719.46 M25.561   2. Closed fracture of right distal femur, sequela 905.4 S72.401S   3. Hyperkalemia - borderline 276.7 E87.5   4. Paroxysmal atrial fibrillation - rate controlled on diltiazem er and metoprolol 427.31 I48.0   5. Essential hypertension stable on BB/CCB 401.9 I10   6. Schizophrenia, unspecified type - control status unknown as he takes no meds 295.90 F20.9   7. Gastroesophageal reflux disease without esophagitis - stable on zantac 530.81 K21.9   8.     Tobacco abuse   --No adjustment to pain meds  --smoking cessation discussed and highly  urged  --f/u with ortho as scheduled  --repeat BMP to follow potassium and treat accordingly  --psych will see patient for schizophrenia  --continue PT/OT as indicated  --will follow. He is medically stable on current tx  Lavalle Skoda S. Perlie Gold  [facility] [address] [place], [place] [address] [phone] Office (Wednesdays and Fridays 8 AM - 5 PM) [phone] Cell (Monday-Friday 8 AM - 5 PM)

## 2014-09-29 DIAGNOSIS — I4891 Unspecified atrial fibrillation: Secondary | ICD-10-CM | POA: Diagnosis not present

## 2014-09-29 DIAGNOSIS — S72401D Unspecified fracture of lower end of right femur, subsequent encounter for closed fracture with routine healing: Secondary | ICD-10-CM | POA: Diagnosis not present

## 2014-09-29 DIAGNOSIS — N181 Chronic kidney disease, stage 1: Secondary | ICD-10-CM | POA: Diagnosis not present

## 2014-09-29 DIAGNOSIS — I129 Hypertensive chronic kidney disease with stage 1 through stage 4 chronic kidney disease, or unspecified chronic kidney disease: Secondary | ICD-10-CM | POA: Diagnosis not present

## 2014-10-11 ENCOUNTER — Ambulatory Visit
Admit: 2014-10-11 | Disposition: A | Discharge status: Home / Self Care | Payer: Self-pay | Attending: Vascular Surgery | Admitting: Vascular Surgery

## 2014-10-11 LAB — BUN: BUN: 22 mg/dL — ABNORMAL HIGH

## 2014-10-11 LAB — CREATININE, SERUM
Creatinine: 1.02 mg/dL
EGFR (African American): >60

## 2014-10-18 ENCOUNTER — Ambulatory Visit
Admit: 2014-10-18 | Disposition: A | Discharge status: Home / Self Care | Payer: Self-pay | Attending: Radiation Oncology | Admitting: Radiation Oncology

## 2014-10-20 LAB — PSA: PSA: 4.7 ng/mL — AB (ref 0.0–4.0)

## 2014-10-27 ENCOUNTER — Ambulatory Visit (INDEPENDENT_AMBULATORY_CARE_PROVIDER_SITE_OTHER): Payer: Medicaid Other | Admitting: Podiatry

## 2014-10-27 DIAGNOSIS — M79673 Pain in unspecified foot: Secondary | ICD-10-CM

## 2014-10-27 DIAGNOSIS — B351 Tinea unguium: Secondary | ICD-10-CM | POA: Diagnosis not present

## 2014-10-27 DIAGNOSIS — Q828 Other specified congenital malformations of skin: Secondary | ICD-10-CM | POA: Diagnosis not present

## 2014-10-27 NOTE — Progress Notes (Signed)
Subjective: 59 y.o.-year-old male returns the office today for painful, elongated, thickened toenails. Denies any redness or drainage around the nails. Denies any acute changes since last appointment and no new complaints today. Denies any systemic complaints such as fevers, chills, nausea, vomiting.   Objective: AAO 3, NAD DP/PT pulses palpable, CRT less than 3 seconds Protective sensation intact with Simms Weinstein monofilament, Achilles tendon reflex intact.  Nails hypertrophic, dystrophic, elongated, brittle, discolored 10. There is tenderness overlying the nails 1-5 bilaterally. There is no surrounding erythema or drainage along the nail sites. Hyperkeratotic lesions bilateral submetatarsal one and 5. Upon debridement on 1 ulceration, drainage or other clinical signs of infection. No open lesions or pre-ulcerative lesions are identified. No other areas of tenderness bilateral lower extremities. No overlying edema, erythema, increased warmth. No pain with calf compression, swelling, warmth, erythema.  Assessment: Patient presents with symptomatic onychomycosis; hyperkerotic lesions   Plan: -Treatment options including alternatives, risks, complications were discussed -Nails sharply debrided 10 without complication/bleeding. -Hyperkeratotic lesion sharply debrided 4 without complication/bleeding. -Discussed daily foot inspection. If there are any changes, to call the office immediately.  -Follow-up in 3 months or sooner if any problems are to arise. In the meantime, encouraged to call the office with any questions, concerns, changes symptoms.
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
## 2014-10-30 NOTE — H&P (Signed)
PATIENT NAME:  Christopher Martinez, PENNA MR#:  [id number] DATE OF BIRTH:  06-05-1956  DATE OF ADMISSION:  07/26/2014  PRIMARY CARE PHYSICIAN: Ashok Norris, MD    CHIEF COMPLAINT: Being found down.   HISTORY OF PRESENT ILLNESS: This is a 59 year old male who apparently was found down on the floor covered in urine and feces by a Education officer, museum. The patient says that about 3 to 4 days ago, he had slipped and fallen in his home. He could not get up, as he was having some pain in his right knee and he was too weak. He lay for a few days apparently, and his Education officer, museum found him covered in urine and feces, and he was sent to the ER. The patient was cleaned up and on blood work was noted to have acute renal failure and also noted to be severely dehydrated. He was also noted to be significantly tachycardic, with heart rates consistently in the 140s. The hospitalist services were contacted for further treatment and evaluation.   REVIEW OF SYSTEMS:  CONSTITUTIONAL: No documented fever. No weight gain. No weight loss.  EYES: No blurred or double vision.  EARS, NOSE, AND THROAT: No tinnitus. No postnasal drip. No redness of the oropharynx.  RESPIRATORY: No cough. No wheeze. No hemoptysis. No dyspnea.  CARDIOVASCULAR: No chest pain. No orthopnea. No palpitations. No syncope.  GASTROINTESTINAL: No nausea. No vomiting, diarrhea. No abdominal pain. No melena or hematochezia.  GENITOURINARY: No dysuria or hematuria.  ENDOCRINE: No polyuria or nocturia. No heat or cold intolerance.  HEMATOLOGIC: No anemia. No bruising. No bleeding.  INTEGUMENTARY: No rashes. No lesions.  MUSCULOSKELETAL: No arthritis. No swelling. No gout.  NEUROLOGIC: No numbness, tingling, or ataxia. No seizure-type activity. NEUROPSYCHIATRIC: Positive history of schizophrenia. No anxiety. No ADD.   PAST MEDICAL HISTORY: Consistent with prostate cancer, hypertension, gastroesophageal reflux disease, schizophrenia.   ALLERGIES: No known drug  allergies.   SOCIAL HISTORY: Does smoke about 1/2 pack per day; has been smoking for the past 40+ years. No alcohol abuse. No illicit drug abuse. Lives at home by himself.   FAMILY HISTORY: The patient cannot recall his family history.   CURRENT MEDICATIONS: Ranitidine 150 mg daily, Cardizem CD 120 mg daily, and aspirin 81 mg daily.   PHYSICAL EXAMINATION: Presently is as follows:  VITAL SIGNS: Temperature 97.5, pulse 142, respirations 18, blood pressure 128/98, saturations 94% on room air.  GENERAL: He is a unkempt but pleasant male, in no apparent distress.  HEAD, EYES, EARS, NOSE, THROAT: He is atraumatic, normocephalic. Extraocular muscles are intact. Pupils equal and reactive to light. Sclerae anicteric. No conjunctival injection. No pharyngeal erythema.  NECK: Supple. No jugular venous distention. No bruits. No lymphadenopathy. No thyromegaly.  HEART: Regular rhythm, tachycardic. No murmurs, no rubs, no clicks.  LUNGS: Clear to auscultation bilaterally. No rales or rhonchi. No wheezes.  ABDOMEN: Soft, flat, nontender, nondistended. Has good bowel sounds. No hepatosplenomegaly appreciated.  EXTREMITIES: No evidence of any cyanosis, clubbing, or peripheral edema. Has +2 pedal and radial pulses bilaterally. His right knee is slightly swollen, but no redness. Positive pain on palpation and limited range of motion.  NEUROLOGIC: He is alert, awake, and oriented x1; it is difficult to do a full neurological exam.  SKIN: Moist and warm, with no rashes.  LYMPHATIC: There is no cervical or axillary lymphadenopathy.   LABORATORY DATA: Serum glucose of 78, BUN 53, creatinine 1.5, sodium 140, potassium 4.3, chloride 103, bicarbonate 24, magnesium 2.6. LFTs within normal limits.  CK  335, CK-MB 12.6. Troponin 0.23. White cell count of 13.1, hemoglobin 13.7, hematocrit 42.2, platelet count 95. INR is 1.0.   DIAGNOSTIC DATA: The patient did have an x-ray of the right knee done, which showed a comminuted  displaced intra-articular distal right femoral metaphyseal fracture. The patient also had a chest x-ray done, which showed no acute cardiopulmonary disease.   ASSESSMENT AND PLAN: This is a 59 year old male with history of prostate cancer, hypertension, gastroesophageal reflux disease, and schizophrenia, who presents to the hospital. He was found down by a Education officer, museum at home covered in feces and urine. He apparently had slipped and fallen 4 days ago and could not get up. He was noted to be in acute renal failure.  1. Acute renal failure. This is likely secondary to severe dehydration and being on the floor for many days. I would aggressively hydrate the patient with IV fluids, follow BUN and creatinine, and urine output. We will place a Foley to follow urine output, renal dose medications, avoid nephrotoxins.  2. Tachycardia. This is likely sinus tachycardia. This is likely related to severe dehydration and also the patient being off his oral Cardizem for days. I will continue aggressive IV fluids, continue his Cardizem, follow his heart rate, and treat the underlying cause.  3. Elevated troponin. This is likely in the setting of demand ischemia from his acute renal failure and tachycardia. We will cycle his cardiac markers and keep him on telemetry, check a 2-dimensional echocardiogram.  4. Hypertension, presently hemodynamically stable. Continue Cardizem.  5. History of prostate cancer. This is currently in remission.  6. Gastroesophageal reflux disease. Continue Zantac.  7. Hip fracture. The patient was noted to have a right hip fracture. This is likely traumatic from his recent fall. I will get an orthopedic consult. The patient is currently in no acute pain. Presently, he is not stable for any surgery given his severe tachycardia and renal failure.   CODE STATUS: The patient is a FULL CODE.    TIME SPENT ON ADMISSION: 50 minutes.   ____________________________ Belia Heman. Verdell Carmine,  MD vjs:MT D: 07/26/2014 13:01:00 ET T: 07/26/2014 13:31:54 ET JOB#: [id number]  cc: Belia Heman. Verdell Carmine, MD, <Dictator> Henreitta Leber MD ELECTRONICALLY SIGNED 08/13/2014 12:50

## 2014-10-30 NOTE — Op Note (Signed)
PATIENT NAME:  Christopher Martinez, Christopher Martinez MR#:  [id number] DATE OF BIRTH:  May 04, 1956  DATE OF PROCEDURE:  07/29/2014  PREOPERATIVE DIAGNOSIS: Right supracondylar femur fracture with intercondylar extension.   POSTOPERATIVE DIAGNOSIS: Right supracondylar femur fracture with intercondylar extension.   PROCEDURE: Open reduction internal fixation of distal femur.  SURGEON: Hessie Knows, M.D.   ANESTHESIA: General.   DESCRIPTION OF PROCEDURE: The patient was brought to the operating room, and after adequate anesthesia was obtained, the patient's right leg was prepped and draped in the usual sterile fashion. A tourniquet was applied to the upper thigh, sterile tourniquet, that was not required during the procedure. After patient identification and timeout procedures were completed, a distal incision was made over the distal femur in line with the IT band. The IT band was split and the lateral aspect of the distal femur was exposed. It took some manipulation to get this reduced, but after adequate reduction subcutaneous plating technique was utilized with a Zimmer NCB distal femur plate, right, slid up along the shaft to the distal femur and a wire was inserted through the plate to hold everything in position, with the proximal K wire holding the plate in the appropriate position for subsequent plating proximally. After getting near anatomical reduction, the distal femur was drilled and screws placed filling all holes and then locking all screw holes with locking mechanism with the system. With all the distal femoral holes filled with the 2 posterior holes being just in the lateral condyle, the fixation appeared stable. Small incisions were made proximally and through 2 small incisions 4 proximal shaft screws were placed using standard technique, drilling, measuring and placing the self-tapping cortical screw. All 4 nonlocking proximal cortical screws were placed. Under stress views, the knee appeared stable and the fracture  appeared stably fixed. AP and lateral imaging was utilized during the procedure with appropriate position of the hardware and again near-anatomical alignment of the joint surface as well as longitudinal alignment with the supracondylar femur fracture. The wound was thoroughly irrigated. The IT band and capsule were closed with running #1 Vicryl, 2-0 Vicryl subcutaneously and skin staples, Xeroform, 4 x 4's, ABD and tape applied, and the patient was sent to the recovery room in stable condition.   ESTIMATED BLOOD LOSS: Minimal.   COMPLICATIONS: None.   SPECIMEN: None.  IMPLANT: Zimmer NCB right supracondylar femur plate with multiple screws.   ____________________________ Laurene Footman, MD [email or web address]:sb D: 07/29/2014 13:07:44 ET T: 07/29/2014 14:55:17 ET JOB#: [id number]  cc: Laurene Footman, MD, <Dictator> Laurene Footman MD ELECTRONICALLY SIGNED 07/31/2014 17:52

## 2014-10-30 NOTE — Consult Note (Signed)
Brief Consult Note: Diagnosis: right supracondylar femur fracture with intercondylar extension.   Patient was seen by consultant.   Recommend to proceed with surgery or procedure.   Comments: recommend ORIF when medically stable.  Electronic Signatures: Laurene Footman (MD)  (Signed 26-Jan-16 20:08)  Authored: Brief Consult Note   Last Updated: 26-Jan-16 20:08 by Laurene Footman (MD)

## 2014-10-30 NOTE — Consult Note (Signed)
PATIENT NAME:  Christopher Martinez, Christopher Martinez MR#:  [id number] DATE OF BIRTH:  08/08/1955  DATE OF CONSULTATION:  07/27/2014  REFERRING PHYSICIAN:   CONSULTING PHYSICIAN:  Corey Skains, MD  CONSULTING PHYSICIAN: Norva Riffle. Marcille Blanco, M.D.   REASON FOR CONSULTATION: Atrial fibrillation with rapid ventricular rate with elevated troponin.   CHIEF COMPLAINT: "Broke my leg."  HISTORY OF PRESENT ILLNESS: This is a 59 year old male with no evidence of previous cardiovascular disease, who has had some hypertension and hyperlipidemia. The patient has had a recent fracture of his right leg and/or ankle area for which he slipped on the ice. At that time, he had some dehydration and did seek medical attention finally in the Emergency Room for which he appeared to be dehydrated and have atrial fibrillation with rapid ventricular rate with nonspecific ST and T wave changes. There was no evidence of myocardial infarction, although there was an elevation of troponin of 0.23, most consistent with demand ischemia. The patient has had control of his heart rate with the addition of beta blocker and calcium channel blocker, but, although, still heart rate is elevated at 120 beats per minute. The patient has not had any chest discomfort or congestive heart failure at this time.   REVIEW OF SYSTEMS: The remainder of the review of systems is negative for vision change, ringing in the ears, hearing loss, cough, congestion, heartburn, nausea, vomiting, diarrhea, bloody stools, stomach pain, extremity pain. Is positive   for leg weakness.   frequent urination, urination at night, muscle weakness, numbness, anxiety, depression, skin lesions and skin rashes.   PAST MEDICAL HISTORY: Essential hypertension, mixed hyperlipidemia.   FAMILY HISTORY: Positive for multiple family members with early onset of cardiovascular disease.   SOCIAL HISTORY: Smokes 1 pack per day, and denies alcohol use.   ALLERGIES TO MEDICATIONS: AS LISTED.   PHYSICAL  EXAMINATION:  VITAL SIGNS: Blood pressure is 110/68 bilaterally. Heart rate is 120 and irregular.  GENERAL: He is a well appearing male in no acute distress.  HEENT: No icterus, thyromegaly, ulcers, hemorrhage, or xanthelasma.  CARDIOVASCULAR: Irregularly irregular with normal S1 and S2 without murmur, gallop, or rub. PMI is inferiorly displaced. Carotid upstroke normal without bruit. Jugular venous pressure is normal.  LUNGS: Clear to auscultation with normal respirations.  ABDOMEN: Soft, nontender, without hepatosplenomegaly or masses. Abdominal aorta is normal size without bruit.  EXTREMITIES: Have 2+ radial, femoral, dorsal pedal pulses, with no lower extremity edema, cyanosis, clubbing or ulcers.  NEUROLOGIC: He is oriented to time, place and person, with normal mood and affect.   ASSESSMENT: A 59 year old male with essential hypertension, mixed hyperlipidemia, and nonvalvular atrial fibrillation with rapid ventricular rate needing further treatment.   RECOMMENDATIONS: 1. Cardizem, long-acting, in combination with a beta blocker for heart rate control and possible spontaneous conversion of atrial fibrillation. 2. Heparin for further risk reduction in stroke while the patient having atrial fibrillation and possible discontinuation for surgery.  3. Echocardiogram for LV systolic dysfunction, valvular heart disease contributing to the above.  4. Possible procession to leg surgery after control of above.    ____________________________ Corey Skains, MD bjk:JT D: 07/27/2014 08:29:59 ET T: 07/27/2014 08:43:37 ET JOB#: [id number]  cc: Corey Skains, MD, <Dictator> Corey Skains MD ELECTRONICALLY SIGNED 07/29/2014 8:37

## 2014-10-30 NOTE — Consult Note (Signed)
Psychiatry: Follow-up for this gentleman in the hospital with fractured leg pending surgery also poor nutritional status.  Patient seen and chart reviewed.  Patient was awake and alert when I came to visit.  Made good eye contact.  Interacted appropriately.  He was alert and oriented to his situation and place.  Remembered me.  Speech is normal in rate and tone.  Possibly decreased in total amount but answered questions appropriately.  He was able to tell me spontaneously that the plan was for surgery today and also that he had been told that the plan was for him to go to a rehabilitation-type facility so that he could heal and regain his strength.  He acknowledged that he had probably not been eating very well at home.  He is taking advantage it seems to eat well here.  No evidence of psychosis.  Affect smiling and upbeat.  Denies suicidal ideation. understand that concerned about this patient who appears to have been poorly nourished at home and the concern about he is not coming into the hospital sooner, but again to my evaluation he appears to be capable of making reasonable medical and personal decisions.  I don't see any justification for saying he lacks capacity.  I hope that a plan can be arrived at for providing services for him either at home or in a new placement without it requiring guardianship.  I find him to be quite agreeable to all the plans that are being offered at this point.  No indication for any psychiatric medicine.  Will follow-up as needed. due to acute illness resolved.  Rule out possible developmental disability.  Electronic Signatures: Gonzella Lex (MD)  (Signed on 29-Jan-16 09:29)  Authored  Last Updated: 29-Jan-16 09:29 by Gonzella Lex (MD)

## 2014-10-30 NOTE — Consult Note (Signed)
PATIENT NAME:  Christopher Martinez, Christopher Martinez MR#:  [id number] DATE OF BIRTH:  1955-10-01  DATE OF CONSULTATION:  07/27/2014  REFERRING PHYSICIAN:   CONSULTING PHYSICIAN:  Gonzella Lex, MD  IDENTIFYING INFORMATION AND REASON FOR CONSULT: A 59 year old man currently in the hospital because of a broken leg and complications. Consultation because DSS requesting guardianship.   HISTORY OF PRESENT ILLNESS: Information obtained from the patient and the chart. The patient was admitted on the 26th with a broken leg. Apparently he had fallen at home a day or 2  earlier, it is not exactly clear and broken his leg. Evidently somebody helped him at that time to get inside, but then a social worker came by to visit him and found that he had not moved and had soiled himself. At that point he was brought to the hospital for treatment. The patient remembers all of this and recounts it to me reasonably well. He says that a friend of his had helped him up, possibly also a policeman at home. He says then he was in pain and could not move on his leg and so waited for his home health person to come and visit him. He is able to tell me that he is in the hospital for a broken leg. He is oriented to his current situation and is able to describe the treatment plan basically well to me. He does not have any other psychiatric complaints. Says that prior to this his mood had been fine. No symptoms of depression. Had felt like he was doing a pretty good job taking care of himself at home. He says that he sleeps reasonably well most of the time and had been eating fine. He does not report any hallucinations or other psychotic symptoms. Totally denies any suicidal or homicidal ideation. Not abusing alcohol or drugs. Conversation with nursing staff is that the patient has been pleasant and cooperative and compliant on the critical care unit, they have not had any concerns about him refusing treatment or being oppositional.   PAST PSYCHIATRIC HISTORY: It  is noted a couple of places in the chart that he has a past diagnosis of schizophrenia. I do not see where he has had any psychiatric treatment that has been noted in the computer chart. I do not see any evidence that he has been on any medicine. The patient tells me that he has never been admitted to a psychiatric hospital. He says that he had had nerve problems in the past for which he had gone to the mental health clinic and used to take some medicine but that that was years ago. He denies ever having been given a diagnosis of schizophrenia. Denies ever having had hallucinations. Denies any suicide attempts or history of violence. No further details available.   SOCIAL HISTORY: The patient lives alone. Says that all of his immediate family have passed away. Has no children. No living siblings. He says he does get help from a home health aide who comes to assist him at home and takes him to buy groceries and that sort of thing. He lives in an apartment and feels like things have been pretty stable there for him. 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 This suggests to me that possibly he has a chronic history of cognitive problems. He tells me he used to drink but gave it up many, many years  ago. No history of drug abuse.   FAMILY HISTORY: Knows of no family history of mental health problems.   PAST MEDICAL HISTORY: Currently has a broken leg which is going to require surgery, but apparently there has been some concern about making sure that his temperature and vital signs were stable and that everything else was under control before they proceeded with surgery. Nurse tells me that he is tentatively scheduled to get surgery on Friday. He says that he takes a high blood pressure medicine at home as well as Zantac, which he knows is for his ulcers. He says his medicines are prescribed by Dr. Rutherford Nail.     REVIEW OF  SYSTEMS: Says his pain is currently under good control. No mood complaints. No complaints about any psychiatric symptoms at all.   MENTAL STATUS EXAMINATION: Neatly groomed gentleman who looks his stated age, cooperative with the interview, quite pleasant. Good eye contact. Normal psychomotor activity under the circumstances. Speech is quiet, a little decreased in amount, but able to have a lucid conversation. Affect smiling, a little constricted. Mood stated as all right. Thoughts are slow and somewhat simple, but lucid. No evidence of delusions or loosening of associations at all. Denies auditory or visual hallucinations. Denies suicidal or homicidal ideation. He can repeat 3 words immediately and remembers 2 of them at 3 minutes. He tells me that he is not able to spell a word backwards, I am suspicious that perhaps he may not be able to read well. He does not know the year, saying that he does not usually keep up on that, but does know the month and the date. Judgment and insight currently seem to be appropriate. He is able to tell me that the doctors say that he needs to have surgery on his leg and to have a plate or some other hardware placed in it. He says that he is happy to have that done and has no problems with it, has no complaints about his medicine.   LABORATORY RESULTS: Chemistry panel, elevated creatinine at 1.57, BUN elevated at 53, appeared to be dehydrated on admission as would be expected. Protein slightly high 8.5, albumin normal. CBC, elevated white count of 13.1, low platelet count at 95,000. 1 + leukocyte esterase, lots of white cells, looks like likely a urinary tract infection at least on the sample that they had when he came in. I do not see that there was an alcohol level or a drug test on admission.   VITAL SIGNS:  Currently temperature 98.4, pulse 59, respirations 20, blood pressure 89/63.  ASSESSMENT: This is a 59 year old man who has some kind of past mental health history,  but is apparently currently not getting any psychiatric medicine or mental health treatment and has been functioning well. No history of hospitalization, suicidality, or dangerousness. He is here in the hospital because he broke his leg. As far as I can tell the concern here was generated because after breaking his leg he delayed getting help for some period of time before being found and that he had soiled himself at the time. On my interview today there is no sign of delirium. I did not do a full Mini-Mental Status Exam but his memory is intact, he is alert and oriented, and he understands his treatment. Some of his past history suggests that he could have had some developmental disability, but evidently has functioned independently well for years. At this point I do not see any indication that he is  a candidate for a competency hearing. He seems to have full capacity to understand his treatment and make decisions at this point. I am not sure if there are other issues involved, I am sure they will let me know if there are. No indication for any medication.   DIAGNOSIS PRINCIPAL AND PRIMARY:   AXIS I:  Delirium due to broken leg, now resolved.   SECONDARY DIAGNOSES:   AXIS I: No further diagnosis.   AXIS II: Deferred.   AXIS III:  1.  Broken leg.  2.  Atrial fibrillation.     ____________________________ Gonzella Lex, MD jtc:bu D: 07/27/2014 17:37:41 ET T: 07/27/2014 18:23:57 ET JOB#: [id number]  cc: Gonzella Lex, MD, <Dictator> Gonzella Lex MD ELECTRONICALLY SIGNED 08/10/2014 17:23

## 2014-10-30 NOTE — Discharge Summary (Signed)
PATIENT NAME:  Christopher Martinez, Christopher Martinez MR#:  [id number] DATE OF BIRTH:  11/12/1955  DATE OF ADMISSION:  07/26/2014 DATE OF DISCHARGE:  08/02/2014   ADMITTING DIAGNOSIS: Status post fall.   DISCHARGE DIAGNOSES:  1.  Fall, due to mechanical reasons, with acute right femur fracture.  2.  Atrial fibrillation with rapid ventricular response; heart rate now improved.  3.  Essential hypertension.  4.  Elevated troponin, felt to be due to demand ischemia.  5.  Acute kidney failure due to dehydration.  6.  Gastroesophageal reflux disease without esophagitis.  7.  Mild systolic dysfunction.  8.  Moderate tricuspid regurgitation.  9.  Mildly elevated pulmonary artery systolic pressures.   HOSPITAL COURSE: Please refer to H and P done by the admitting physician. The patient is a 59 year old African American male who was found on the floor after he had slipped. The patient was on the floor for a few days and was subsequently brought to the ED. He was noted to have a right hip fracture. Along with that, he was also noticed to have AFib with RVR. Due to this, his surgery was delayed. He was seen in consultation by cardiology.  The patient went to the Operating Room on 07/29/2014. He tolerated the procedure well. In terms of his AFib, his heart rate is stable with Cardizem and metoprolol. He is only treated with aspirin. Unfortunately, he does not seem to be a good anticoagulation candidate. At this point, there was also some concern about his ability to make decisions, and DSS wanted him to be deemed incompetent. However, he was seen by psychiatry, and they did not feel that he was incompetent. The patient also had some shortness of breath and hypoxia. Chest x-ray was suggestive of possible systolic CHF. He was given Lasix. The patient is doing much better, but he will need further rehabilitation.   DISCHARGE MEDICATIONS: Aspirin 81 mg 1 tablet p.o. daily, ranitidine 150 b.i.d., diltiazem 240 mg daily, oxycodone 5 mg q. 4  hours p.r.n. for a moderate amount of pain. Lovenox 40 mg subcutaneous daily x 14 days, metoprolol tartrate 50 mg 1 tablet p.o. q. 12 hours, magnesium hydroxide 30 mL q. 6 hours p.r.n. for constipation, Ensure 1 can t.i.d.   DIET: Low-sodium, low-fat, low-cholesterol diet.   ACTIVITY: As tolerated.   FOLLOWUP: With primary MD in 1-2 weeks. Follow up with [facility] in 2 weeks. Please refer to [facility] discharge instructions for wound care, etc.   ACTIVITY: As tolerated with physical therapy. Right toe-touch weightbearing.   TIME SPENT: 35 minutes.     ____________________________ Lafonda Mosses Posey Pronto, MD shp:MT D: 08/02/2014 13:03:58 ET T: 08/02/2014 13:19:51 ET JOB#: [id number]  cc: Lurine Imel H. Posey Pronto, MD, <Dictator> Alric Seton MD ELECTRONICALLY SIGNED 08/04/2014 12:36

## 2014-10-30 NOTE — Op Note (Signed)
PATIENT NAME:  Christopher Martinez, Christopher Martinez MR#:  [id number] DATE OF BIRTH:  06-29-1956  DATE OF PROCEDURE:  10/11/2014  PREOPERATIVE DIAGNOSIS: Atherosclerotic occlusive disease, bilateral lower extremities, with ulceration of the feet.   POSTOPERATIVE DIAGNOSIS: Atherosclerotic occlusive disease, bilateral lower extremities, with ulceration of the feet.  PROCEDURES PERFORMED:  1.  Abdominal aortogram.  2.  Bilateral lower extremity distal runoff.  3.  Third order catheter placement, left superficial femoral artery.   SURGEON: Katha Cabal, MD   SEDATION: Versed plus fentanyl.   CONTRAST USED: Isovue 70 mL.   FLUOROSCOPY TIME: 4.1 minutes.   ACCESS: A 5 French sheath, right common femoral artery.   INDICATIONS: Christopher Martinez is a 59 year old gentleman who presents to the office for evaluation of bilateral lower extremity ulcerations in association with peripheral vascular disease. The risks and benefits for angiography were reviewed. All questions were answered. The patient has agreed to proceed.   DESCRIPTION OF PROCEDURE: The patient is taken to the special procedure suite, placed in the supine position. After adequate sedation is achieved, both groins are prepped and draped in sterile fashion. Appropriate timeout is called.   Lidocaine 1% is infiltrated in the soft tissues in the right groin and ultrasound is placed in a sterile sleeve. Common femoral artery is identified. It is echolucent and compressible in its proximal segment. in its mid and distal portion, there is extensive plaque with what appears to be greater than 80% narrowing on ultrasound scanning. Therefore, the more proximal portion is selected and accessed with a microneedle, microwire followed by micro sheath, J-wire followed by a 5 French sheath, and pigtail catheter. The pigtail catheter is positioned at the level of T12 and AP projection of the aorta is obtained. Pigtail catheter is then repositioned to above the bifurcation and an  RAO projection of the pelvis is obtained. Stiff angled Glidewire and pigtail catheter is then used to cross the bifurcation and advance down to the distal external where an LAO projection of the groin is obtained, and then the catheter and wire are negotiated into the SFA where distal runoff is obtained. After review of the images, there does not appear to be a lesion associated with his current status that is greater than 50%, and therefore no intervention is required on the left, and he does have several areas of diffuse disease, but again it does not appear to be hemodynamically significant. Therefore, given the ulcerations on his right side as well via hand injection through the sheath on the right, right lower extremity distal runoff is obtained. After review of these images, a Mynx device is used for closure without difficulty.   INTERPRETATION: The abdominal aorta is opacified with a bolus injection of contrast. There is diffuse calcific plaque noted. There are no hemodynamically significant lesions noted in the aorta, bilateral common, and bilateral external iliacs.   The left common femoral, profunda femoris and superficial femoral are patent. Again, there is diffuse disease with several areas in the superficial femoral that appear to be 30% to 40% narrowing and this is also true in the tibials, but essentially there is 3-vessel runoff to the foot. Therefore, intervention is not required.   On the right, the common femoral demonstrates a 70% narrowing. It is an eccentric plaque located in the mid and distal portion; however, the profunda femoris, superficial femoral and tibials again are patent. There is diffuse calcific disease, but no hemodynamically significant narrowing.   SUMMARY: The patient will be continued to treat with expectant  care, as he appears to have adequate perfusion on the left. Should the right side be a persistent problem, then femoral endarterectomy would be recommended and  this was discussed with the family.    ____________________________ Katha Cabal, MD ggs:TM D: 10/12/2014 17:45:12 ET T: 10/12/2014 21:52:44 ET JOB#: [id number]  cc: Katha Cabal, MD, <Dictator> Katha Cabal MD ELECTRONICALLY SIGNED 10/18/2014 12:55

## 2014-11-02 ENCOUNTER — Other Ambulatory Visit: Payer: Self-pay | Admitting: *Deleted

## 2014-11-02 DIAGNOSIS — I480 Paroxysmal atrial fibrillation: Secondary | ICD-10-CM

## 2014-11-02 MED ORDER — ASPIRIN 81 MG PO TABS: 81.0000 mg | ORAL_TABLET | Freq: Every day | ORAL | Status: DC

## 2014-11-02 MED ORDER — RANITIDINE HCL 150 MG PO TABS: 150.0000 mg | ORAL_TABLET | Freq: Two times a day (BID) | ORAL | Status: DC

## 2014-11-02 MED ORDER — DILTIAZEM HCL ER 240 MG PO CP24: 240.0000 mg | ORAL_CAPSULE | Freq: Every day | ORAL | Status: DC

## 2014-11-02 MED ORDER — METOPROLOL TARTRATE 50 MG PO TABS: 75.0000 mg | ORAL_TABLET | Freq: Two times a day (BID) | ORAL | Status: DC

## 2014-11-08 ENCOUNTER — Other Ambulatory Visit: Payer: Self-pay | Admitting: Oncology

## 2014-11-08 DIAGNOSIS — C669 Malignant neoplasm of unspecified ureter: Secondary | ICD-10-CM | POA: Insufficient documentation

## 2014-12-12 ENCOUNTER — Ambulatory Visit (INDEPENDENT_AMBULATORY_CARE_PROVIDER_SITE_OTHER): Payer: Medicaid Other

## 2014-12-12 DIAGNOSIS — Z111 Encounter for screening for respiratory tuberculosis: Secondary | ICD-10-CM

## 2014-12-12 MED ORDER — TUBERCULIN PPD 5 UNIT/0.1ML ID SOLN
5.0000 [IU] | Freq: Once | INTRADERMAL | Status: AC
  Administered 2014-12-12: 5 [IU] via INTRADERMAL

## 2014-12-14 ENCOUNTER — Telehealth: Payer: Self-pay | Admitting: Family Medicine

## 2014-12-14 ENCOUNTER — Encounter: Payer: Medicaid Other | Admitting: Emergency Medicine

## 2014-12-14 NOTE — Telephone Encounter (Signed)
Santiago Glad from [employer] had faxed a Length of Stay form to be filled out. It has been filled out before by Dr Nadine Counts. However, the patient does not know Dr Nadine Counts, therefore, Social Services is requesting that Dr Rutherford Nail sign off on it. Please fill out and fax it to [phone]. If you have any questions please call Santiago Glad at [phone]

## 2014-12-14 NOTE — Progress Notes (Cosign Needed)
Results were resulted in previous encounter.  The reading was negative.

## 2014-12-16 NOTE — Telephone Encounter (Signed)
Have not seen form as of yet for Dr. Rutherford Nail to sign off on. Pt was here twice this week for TB screening and form was not mentioned by caregiver. I do see the first form in old chart which was signed on 10/31/2014.

## 2014-12-21 NOTE — Telephone Encounter (Signed)
Do you know if this has been taken care of?

## 2015-01-11 ENCOUNTER — Telehealth: Payer: Self-pay | Admitting: Family Medicine

## 2015-01-11 DIAGNOSIS — K2100 Gastro-esophageal reflux disease with esophagitis, without bleeding: Secondary | ICD-10-CM

## 2015-01-11 NOTE — Telephone Encounter (Signed)
Caregiver Vaughan Basta called stating that the zantac is not working for pt and wants to know if something else can be called into [facility]

## 2015-01-12 ENCOUNTER — Other Ambulatory Visit: Payer: Self-pay | Admitting: Emergency Medicine

## 2015-01-12 MED ORDER — RANITIDINE HCL 150 MG PO TABS: 150.0000 mg | ORAL_TABLET | Freq: Two times a day (BID) | ORAL | Status: DC

## 2015-01-12 NOTE — Telephone Encounter (Signed)
Please advise 

## 2015-01-13 MED ORDER — OMEPRAZOLE 20 MG PO CPDR: 20.0000 mg | DELAYED_RELEASE_CAPSULE | Freq: Every day | ORAL | Status: DC

## 2015-01-17 ENCOUNTER — Other Ambulatory Visit: Payer: Self-pay

## 2015-01-17 MED ORDER — OMEPRAZOLE 20 MG PO CPDR: 20.0000 mg | DELAYED_RELEASE_CAPSULE | Freq: Every day | ORAL | Status: DC

## 2015-01-19 ENCOUNTER — Telehealth: Payer: Self-pay | Admitting: Family Medicine

## 2015-01-19 DIAGNOSIS — N528 Other male erectile dysfunction: Secondary | ICD-10-CM

## 2015-01-19 NOTE — Telephone Encounter (Signed)
Christopher Martinez would like for you to return her call.

## 2015-01-19 NOTE — Telephone Encounter (Signed)
[facility] CALL

## 2015-01-23 NOTE — Telephone Encounter (Signed)
He can be referred to urologist, but I do not prescribe this for people a group home

## 2015-01-23 NOTE — Telephone Encounter (Signed)
Spoke to [place] at care home. Patient is complaining of Erectile dysfunction. Patient stated he want to have sex and cannot. Would like for you to prescribe him something.

## 2015-01-24 NOTE — Telephone Encounter (Signed)
Spoke to [place] at [facility]. She would like referral for Urology. Office to make patient appointment and notify

## 2015-01-25 ENCOUNTER — Other Ambulatory Visit: Payer: Self-pay

## 2015-01-25 DIAGNOSIS — I480 Paroxysmal atrial fibrillation: Secondary | ICD-10-CM

## 2015-01-25 MED ORDER — DILTIAZEM HCL ER 240 MG PO CP24: 240.0000 mg | ORAL_CAPSULE | Freq: Every day | ORAL | Status: DC

## 2015-01-26 ENCOUNTER — Ambulatory Visit: Payer: Medicaid Other

## 2015-01-26 ENCOUNTER — Other Ambulatory Visit: Payer: Self-pay | Admitting: Family Medicine

## 2015-01-30 ENCOUNTER — Telehealth: Payer: Self-pay | Admitting: Family Medicine

## 2015-01-30 NOTE — Telephone Encounter (Signed)
Vaughan Basta (caretaker) is requesting a referral to Pulmonologist.

## 2015-02-02 ENCOUNTER — Ambulatory Visit (INDEPENDENT_AMBULATORY_CARE_PROVIDER_SITE_OTHER): Payer: Medicaid Other | Admitting: Podiatry

## 2015-02-02 DIAGNOSIS — M79673 Pain in unspecified foot: Secondary | ICD-10-CM

## 2015-02-02 DIAGNOSIS — B351 Tinea unguium: Secondary | ICD-10-CM

## 2015-02-02 DIAGNOSIS — Q828 Other specified congenital malformations of skin: Secondary | ICD-10-CM

## 2015-02-02 NOTE — Progress Notes (Signed)
Subjective: 59 y.o.-year-old male returns the office today for painful, elongated, thickened toenails which he is unable to trim himself. Denies any redness or drainage around the nails. Denies any acute changes since last appointment and no new complaints today. Denies any systemic complaints such as fevers, chills, nausea, vomiting.   Objective: AAO 3, NAD DP/PT pulses palpable, CRT less than 3 seconds  Nails hypertrophic, dystrophic, elongated, brittle, discolored 10. There is tenderness overlying the nails 1-5 bilaterally. There is no surrounding erythema or drainage along the nail sites. Hyperkeratotic lesions bilateral submetatarsal 1 and 5. Upon debridement no open ulceration, drainage or other clinical signs of infection. No other open lesions or pre-ulcerative lesions are identified. No other areas of tenderness bilateral lower extremities. No overlying edema, erythema, increased warmth. No pain with calf compression, swelling, warmth, erythema.  Assessment: Patient presents with symptomatic onychomycosis; hyperkerotic lesions   Plan: -Treatment options including alternatives, risks, complications were discussed -Nails sharply debrided 10 without complication/bleeding. -Hyperkeratotic lesion sharply debrided 3 without complication/bleeding. -Discussed daily foot inspection. If there are any changes, to call the office immediately.  -Follow-up in 3 months or sooner if any problems are to arise. In the meantime, encouraged to call the office with any questions, concerns, changes symptoms.   Celesta Gentile, DPM

## 2015-02-07 NOTE — Telephone Encounter (Signed)
Do you know if this has been taken care of?

## 2015-02-17 ENCOUNTER — Telehealth: Payer: Self-pay | Admitting: Emergency Medicine

## 2015-02-17 DIAGNOSIS — I272 Pulmonary hypertension, unspecified: Secondary | ICD-10-CM

## 2015-02-27 NOTE — Telephone Encounter (Signed)
Erroneous encounter

## 2015-03-07 ENCOUNTER — Ambulatory Visit (INDEPENDENT_AMBULATORY_CARE_PROVIDER_SITE_OTHER): Payer: Medicaid Other | Admitting: Family Medicine

## 2015-03-07 ENCOUNTER — Encounter: Payer: Self-pay | Admitting: Family Medicine

## 2015-03-07 ENCOUNTER — Ambulatory Visit: Payer: Self-pay | Admitting: Family Medicine

## 2015-03-07 VITALS — BP 120/70 | HR 78 | Temp 97.8°F | Resp 16 | Ht 63.0 in | Wt 112.1 lb

## 2015-03-07 DIAGNOSIS — I130 Hypertensive heart and chronic kidney disease with heart failure and stage 1 through stage 4 chronic kidney disease, or unspecified chronic kidney disease: Secondary | ICD-10-CM

## 2015-03-07 DIAGNOSIS — I519 Heart disease, unspecified: Secondary | ICD-10-CM

## 2015-03-07 DIAGNOSIS — E785 Hyperlipidemia, unspecified: Secondary | ICD-10-CM | POA: Diagnosis not present

## 2015-03-07 DIAGNOSIS — I071 Rheumatic tricuspid insufficiency: Secondary | ICD-10-CM | POA: Diagnosis not present

## 2015-03-07 DIAGNOSIS — J449 Chronic obstructive pulmonary disease, unspecified: Secondary | ICD-10-CM | POA: Insufficient documentation

## 2015-03-07 DIAGNOSIS — I270 Primary pulmonary hypertension: Secondary | ICD-10-CM | POA: Diagnosis not present

## 2015-03-07 DIAGNOSIS — I5189 Other ill-defined heart diseases: Secondary | ICD-10-CM

## 2015-03-07 DIAGNOSIS — I509 Heart failure, unspecified: Secondary | ICD-10-CM

## 2015-03-07 DIAGNOSIS — I272 Pulmonary hypertension, unspecified: Secondary | ICD-10-CM

## 2015-03-07 DIAGNOSIS — Z23 Encounter for immunization: Secondary | ICD-10-CM | POA: Diagnosis not present

## 2015-03-07 DIAGNOSIS — J431 Panlobular emphysema: Secondary | ICD-10-CM | POA: Diagnosis not present

## 2015-03-07 DIAGNOSIS — N189 Chronic kidney disease, unspecified: Secondary | ICD-10-CM

## 2015-03-07 DIAGNOSIS — I482 Chronic atrial fibrillation, unspecified: Secondary | ICD-10-CM

## 2015-03-07 DIAGNOSIS — I10 Essential (primary) hypertension: Secondary | ICD-10-CM

## 2015-03-07 MED ORDER — LORAZEPAM 0.5 MG PO TABS: 0.5000 mg | ORAL_TABLET | Freq: Two times a day (BID) | ORAL | Status: DC

## 2015-03-07 MED ORDER — LORAZEPAM 0.5 MG PO TABS: 0.5000 mg | ORAL_TABLET | Freq: Two times a day (BID) | ORAL | Status: DC | PRN

## 2015-03-07 NOTE — Addendum Note (Signed)
Addended by: Vitaliy Eisenhour G on: 03/07/2015 02:30 PM   Modules accepted: Orders

## 2015-03-07 NOTE — Progress Notes (Signed)
Name: Christopher Martinez   MRN: [id number]    DOB: 1956-03-21   Date:03/07/2015       Progress Note  Subjective  Chief Complaint  Chief Complaint  Patient presents with  . Hypertension    4 month follow up  . Gastrophageal Reflux    HPI   Atrial fibrillation Patient has a history of chronic atrial fibrillation. He is currently anticoagulated and seeing his cardiologist several times per year. He currently is not aware of any irregularity of his heartbeat but hadn't had some momentary chest pains intermittently. He is not taking any nitroglycerin. He is currently on Xarelto daily.   Hypertensive renal and heart disease  Patient has had mild chest discomfort as noted above. There is no orthopnea or PND or lower extremity swelling. He's currently on a regimen of metoprolol 25 mg twice a day diltiazem XRT and 40 once daily aspirin once daily and Zaroxolyn daily. Pulmonary hypertension  Emphysema  Patient continues to smoke despite warnings contrary. He has COPD but will not take his inhalers with any regularity. Numerous attempts at smoking cessation have been tried with nicotine patch.    Cognitive impairment  Patient has impaired cognition of long-standing. He lives in a group home setting. He is currently on Ativan 0.5 mg by mouth daily at bedtime but staff states he is somewhat agitated during the day at times needs a twice a day dosing regimen to feel. He is not currently being seen by mental health professional who lives alone.  Past Medical History  Diagnosis Date  . Frequent headaches   . Chest pain   . Fall at home   . Femur fracture, right   . Hypertension   . Elevated troponin   . GERD (gastroesophageal reflux disease)   . Acute renal failure   . Mild left ventricular systolic dysfunction   . Pulmonary hypertension   . A-fib   . Prostate cancer   . Schizophrenia   . Hyperlipidemia   . Severe protein-calorie malnutrition     Social History  Substance Use Topics  .  Smoking status: Current Every Day Smoker -- 1.00 packs/day    Types: Cigarettes  . Smokeless tobacco: Not on file  . Alcohol Use: No     Current outpatient prescriptions:  .  enoxaparin (LOVENOX) 100 MG/ML injection, Inject 100 mg into the skin., Disp: , Rfl:  .  LORazepam (ATIVAN) 0.5 MG tablet, Take 0.5 mg by mouth every 8 (eight) hours., Disp: , Rfl:  .  aspirin 81 MG tablet, Take 1 tablet (81 mg total) by mouth daily., Disp: 30 tablet, Rfl: 3 .  diltiazem (DILACOR XR) 240 MG 24 hr capsule, Take 1 capsule (240 mg total) by mouth daily., Disp: 90 capsule, Rfl: 1 .  magnesium hydroxide (MILK OF MAGNESIA) 800 MG/5ML suspension, Take 30 mLs by mouth every 6 (six) hours as needed for constipation., Disp: , Rfl:  .  metoprolol tartrate (LOPRESSOR) 25 MG tablet, TAKE 3 TABLETS (75MG ) BY MOUTH 2 TIMES A DAY, Disp: 180 tablet, Rfl: 0 .  nicotine (NICODERM CQ - DOSED IN MG/24 HOURS) 21 mg/24hr patch, Place 21 mg onto the skin daily., Disp: , Rfl:  .  oxyCODONE (OXY IR/ROXICODONE) 5 MG immediate release tablet, Take 5 mg by mouth every 4 (four) hours as needed for severe pain (Give 1 tab q4hrs prn moderate pain. May take 2 tabs q4h prn severe pain)., Disp: , Rfl:  .  ranitidine (ZANTAC) 150 MG tablet, Take 1 tablet (  150 mg total) by mouth 2 (two) times daily., Disp: 60 tablet, Rfl: 3  No Known Allergies  Review of Systems  Constitutional: Positive for weight loss. Negative for fever and chills.       Rather cachectic cognitively impaired male.  HENT: Negative for congestion, hearing loss, sore throat and tinnitus.   Eyes: Negative for blurred vision, double vision and redness.  Respiratory: Positive for shortness of breath. Negative for cough and hemoptysis.   Cardiovascular: Positive for chest pain and palpitations. Negative for orthopnea, claudication and leg swelling.  Gastrointestinal: Negative for heartburn, nausea, vomiting, diarrhea, constipation and blood in stool.  Genitourinary:  Positive for frequency. Negative for dysuria, urgency and hematuria.  Musculoskeletal: Negative for myalgias, back pain, joint pain, falls and neck pain.  Skin: Negative for itching.  Neurological: Negative for dizziness, tingling, tremors, focal weakness, seizures, loss of consciousness, weakness and headaches.  Endo/Heme/Allergies: Does not bruise/bleed easily.  Psychiatric/Behavioral: Negative for depression and substance abuse. The patient is nervous/anxious and has insomnia.        Moderate cognitive impairment     Objective  Filed Vitals:   03/07/15 1150  BP: 120/70  Pulse: 78  Temp: 97.8 F (36.6 C)  TempSrc: Oral  Resp: 16  Height: 5\' 3"  (1.6 m)  Weight: 112 lb 1.6 oz (50.848 kg)  SpO2: 94%     Physical Exam  Constitutional: He is oriented to person, place, and time.  Slight cachexia  HENT:  Head: Normocephalic.  Eyes: EOM are normal. Pupils are equal, round, and reactive to light.  Neck: Normal range of motion. Neck supple. No thyromegaly present.  Cardiovascular: Normal rate, regular rhythm and normal heart sounds.   No murmur heard. Pulmonary/Chest: No respiratory distress. He has no wheezes.  Abdominal: Soft. Bowel sounds are normal.  Musculoskeletal: Normal range of motion. He exhibits no edema.  Lymphadenopathy:    He has no cervical adenopathy.  Neurological: He is alert and oriented to person, place, and time. No cranial nerve deficit. Gait normal. Coordination normal.  Skin: Skin is warm and dry. No rash noted.  Psychiatric:  . Patient unable to communicate. Insight is modestly sufficient to understand      Assessment & Plan  1. Chronic atrial fibrillation Followed by cardiologist   - CBC - Comprehensive Metabolic Panel (CMET) - TSH - Lipid panel  2. Heart & renal disease, hypertensive, with heart failure CMP  3. Mild pulmonary hypertension Followed by cardiologist - CBC - Comprehensive Metabolic Panel (CMET) - TSH  4. Mild left  ventricular systolic dysfunction Followed by cardiologist - Comprehensive Metabolic Panel (CMET)  5. Moderate tricuspid regurgitation Per cardiologist  6. Essential hypertension  - CBC - Comprehensive Metabolic Panel (CMET) - TSH  7. Panlobular emphysema Encourage discontinuation of smoking  8. Hyperlipidemia Labs - Lipid panel  9. Need for influenza vaccination Given - Flu Vaccine QUAD 36+ mos PF IM (Fluarix & Fluzone Quad PF)

## 2015-03-08 LAB — COMPREHENSIVE METABOLIC PANEL
A/G RATIO: 1 — AB (ref 1.1–2.5)
ALT: 19 IU/L (ref 0–44)
AST: 23 IU/L (ref 0–40)
Albumin: 3.9 g/dL (ref 3.6–4.8)
Alkaline Phosphatase: 119 IU/L — ABNORMAL HIGH (ref 39–117)
BUN/Creatinine Ratio: 14 (ref 10–22)
BUN: 14 mg/dL (ref 8–27)
Bilirubin Total: 0.3 mg/dL (ref 0.0–1.2)
CALCIUM: 9.5 mg/dL (ref 8.6–10.2)
CO2: 27 mmol/L (ref 18–29)
Chloride: 97 mmol/L (ref 97–108)
Creatinine, Ser: 0.98 mg/dL (ref 0.76–1.27)
GFR calc Af Amer: 96 mL/min/{1.73_m2} (ref >59)
GFR, EST NON AFRICAN AMERICAN: 83 mL/min/{1.73_m2} (ref >59)
Globulin, Total: 4.1 g/dL (ref 1.5–4.5)
Glucose: 86 mg/dL (ref 65–99)
POTASSIUM: 5.1 mmol/L (ref 3.5–5.2)
Sodium: 137 mmol/L (ref 134–144)
Total Protein: 8 g/dL (ref 6.0–8.5)

## 2015-03-08 LAB — CBC
Hematocrit: 42.5 % (ref 37.5–51.0)
Hemoglobin: 14 g/dL (ref 12.6–17.7)
MCH: 27.7 pg (ref 26.6–33.0)
MCHC: 32.9 g/dL (ref 31.5–35.7)
MCV: 84 fL (ref 79–97)
PLATELETS: 236 10*3/uL (ref 150–379)
RBC: 5.05 x10E6/uL (ref 4.14–5.80)
RDW: 15.9 % — ABNORMAL HIGH (ref 12.3–15.4)
WBC: 10.9 10*3/uL — AB (ref 3.4–10.8)

## 2015-03-08 LAB — TSH: TSH: 3.6 u[IU]/mL (ref 0.450–4.500)

## 2015-03-08 LAB — LIPID PANEL
CHOL/HDL RATIO: 3.7 ratio (ref 0.0–5.0)
CHOLESTEROL TOTAL: 125 mg/dL (ref 100–199)
HDL: 34 mg/dL — AB (ref >39)
LDL CALC: 77 mg/dL (ref 0–99)
TRIGLYCERIDES: 72 mg/dL (ref 0–149)
VLDL Cholesterol Cal: 14 mg/dL (ref 5–40)

## 2015-03-09 ENCOUNTER — Ambulatory Visit: Payer: Medicaid Other

## 2015-03-09 ENCOUNTER — Ambulatory Visit (INDEPENDENT_AMBULATORY_CARE_PROVIDER_SITE_OTHER): Payer: Medicaid Other | Admitting: Obstetrics and Gynecology

## 2015-03-09 ENCOUNTER — Encounter: Payer: Self-pay | Admitting: Obstetrics and Gynecology

## 2015-03-09 VITALS — BP 125/81 | HR 71 | Resp 18 | Ht 63.0 in | Wt 112.2 lb

## 2015-03-09 DIAGNOSIS — N529 Male erectile dysfunction, unspecified: Secondary | ICD-10-CM

## 2015-03-09 DIAGNOSIS — C61 Malignant neoplasm of prostate: Secondary | ICD-10-CM | POA: Diagnosis not present

## 2015-03-09 NOTE — Progress Notes (Signed)
03/09/2015 9:11 PM   Christopher Martinez [birth year] [id number]  Referring provider: Ashok Norris, MD [address] [place], [place] [address]  Chief Complaint  Patient presents with  . Establish Care  . Erectile Dysfunction    HPI: Patient is a 59 year old male, with a history of A-fib, kidney disease, CHF,  hypertension, prostate cancer and hyperlipidemia, who is a resident at a group home presenting today as a referral from his primary care provider for erectile dysfunction. Patient reports he is experienced this problem for approximately a year and a half that recently became more bothered by it when he met a new sexual partner and was unable to achieve an erection.  Treatment for his prostate cancer has included external beam radiation therapy as well as radioactive seed placement. Followed by Dr. Donella Stade at the [facility] cancer center. He has a significant cardiac history and is currently on anticoagulation therapy. His cardiologist is Dr. Clayborn Bigness @ [facility]. He has also recurrent every day smoker 1 pack per day 45 years. He denies any urinary symptoms including frequency, gross hematuria, hesitancy, or intermittent stream.   PMH: Past Medical History  Diagnosis Date  . Frequent headaches   . Chest pain   . Fall at home   . Femur fracture, right   . Hypertension   . Elevated troponin   . GERD (gastroesophageal reflux disease)   . Acute renal failure   . Mild left ventricular systolic dysfunction   . Pulmonary hypertension   . A-fib   . Prostate cancer   . Schizophrenia   . Hyperlipidemia   . Severe protein-calorie malnutrition     Surgical History: Past Surgical History  Procedure Laterality Date  . Orif right femur Right 07-29-2014    Home Medications:    Medication List       This list is accurate as of: 03/09/15  9:11 PM.  Always use your most recent med list.               acetaminophen 500 MG tablet  Commonly known as:  TYLENOL  Take 1,000 mg by mouth  every 6 (six) hours as needed.     aspirin 81 MG tablet  Take 1 tablet (81 mg total) by mouth daily.     diltiazem 240 MG 24 hr capsule  Commonly known as:  DILACOR XR  Take 1 capsule (240 mg total) by mouth daily.     enoxaparin 100 MG/ML injection  Commonly known as:  LOVENOX  Inject 100 mg into the skin.     fexofenadine 180 MG tablet  Commonly known as:  ALLEGRA  Take 180 mg by mouth daily.     LORazepam 0.5 MG tablet  Commonly known as:  ATIVAN  Take 1 tablet (0.5 mg total) by mouth 2 (two) times daily as needed for anxiety.     magnesium hydroxide 800 MG/5ML suspension  Commonly known as:  MILK OF MAGNESIA  Take 30 mLs by mouth every 6 (six) hours as needed for constipation.     metoprolol tartrate 25 MG tablet  Commonly known as:  LOPRESSOR  TAKE 3 TABLETS (75MG) BY MOUTH 2 TIMES A DAY     nicotine 21 mg/24hr patch  Commonly known as:  NICODERM CQ - dosed in mg/24 hours  Place 21 mg onto the skin daily.     omeprazole 20 MG capsule  Commonly known as:  PRILOSEC  Take 20 mg by mouth daily.     oxyCODONE 5 MG  immediate release tablet  Commonly known as:  Oxy IR/ROXICODONE  Take 5 mg by mouth every 4 (four) hours as needed for severe pain (Give 1 tab q4hrs prn moderate pain. May take 2 tabs q4h prn severe pain).     ranitidine 150 MG tablet  Commonly known as:  ZANTAC  Take 1 tablet (150 mg total) by mouth 2 (two) times daily.        Allergies:  Allergies  Allergen Reactions  . Thorazine [Chlorpromazine]     Family History: No family history on file.  Social History:  reports that he has been smoking Cigarettes.  He has been smoking about 1.00 pack per day. He does not have any smokeless tobacco history on file. He reports that he does not drink alcohol or use illicit drugs.  ROS: UROLOGY Frequent Urination?: No Hard to postpone urination?: No Burning/pain with urination?: No Get up at night to urinate?: Yes Leakage of urine?: No Urine stream  starts and stops?: No Trouble starting stream?: No Do you have to strain to urinate?: No Blood in urine?: No Urinary tract infection?: No Sexually transmitted disease?: No Injury to kidneys or bladder?: No Painful intercourse?: No Weak stream?: No Erection problems?: Yes Penile pain?: No  Gastrointestinal Nausea?: No Vomiting?: No Indigestion/heartburn?: Yes Diarrhea?: No Constipation?: No  Constitutional Fever: No Night sweats?: No Weight loss?: No Fatigue?: No  Skin Skin rash/lesions?: No Itching?: No  Eyes Blurred vision?: No Double vision?: No  Ears/Nose/Throat Sore throat?: No Sinus problems?: No  Hematologic/Lymphatic Swollen glands?: No Easy bruising?: No  Cardiovascular Leg swelling?: No Chest pain?: No  Respiratory Cough?: No Shortness of breath?: No  Endocrine Excessive thirst?: No  Musculoskeletal Back pain?: No Joint pain?: No  Neurological Headaches?: No Dizziness?: No  Psychologic Depression?: No Anxiety?: No  Physical Exam: BP 125/81 mmHg  Pulse 71  Resp 18  Ht 5' 3"  (1.6 m)  Wt 112 lb 3.2 oz (50.894 kg)  BMI 19.88 kg/m2  Constitutional:  Alert and oriented, No acute distress. HEENT: [place]  AT, moist mucus membranes.  Trachea midline, no masses. Cardiovascular: No clubbing, cyanosis, or edema. Respiratory: Normal respiratory effort, no increased work of breathing. GI: Abdomen is soft, nontender, nondistended, no abdominal masses GU: No CVA tenderness. Uncircumcised phallus, foreskin easily retracted, normal scrotum with testes descended bilaterally, no palpable masses or tenderness, prostate nonpalpable status post radiation treatment Skin: No rashes, bruises or suspicious lesions. Lymph: No cervical or inguinal adenopathy. Neurologic: Grossly intact, no focal deficits, moving all 4 extremities. Psychiatric: Normal mood and affect.  Laboratory Data: Lab Results  Component Value Date   WBC 10.9* 03/07/2015   HGB 9.5*  07/31/2014   HCT 42.5 03/07/2015   MCV 96 07/28/2014   PLT 137* 07/30/2014    Lab Results  Component Value Date   CREATININE 0.98 03/07/2015    Lab Results  Component Value Date   PSA 4.7* 10/19/2014   PSA 5.5* 04/15/2014   PSA 4.5* 10/13/2013    No results found for: TESTOSTERONE  No results found for: HGBA1C  Urinalysis    Component Value Date/Time   COLORURINE Yellow 07/26/2014 2132   APPEARANCEUR Hazy 07/26/2014 2132   LABSPEC 1.017 07/26/2014 2132   PHURINE 5.0 07/26/2014 2132   GLUCOSEU Negative 07/26/2014 2132   HGBUR 1+ 07/26/2014 2132   BILIRUBINUR Negative 07/26/2014 2132   KETONESUR Negative 07/26/2014 2132   PROTEINUR 30 mg/dL 07/26/2014 2132   NITRITE Positive 07/26/2014 2132   LEUKOCYTESUR 1+ 07/26/2014 2132  Assessment & Plan:  59 year old African-American male with significant comorbidities presenting with complaint of erectile dysfunction.  1. Erectile dysfunction-Patient ED most likely multifactorial and related to his chronic comorbidities as well as his radiation treatment for his prostate cancer. I am reluctant to prescribe him any medication especialy PDE5 inhibitors until he is cleared by his cardiologist for sexual activity. I would also appreciate any recommendations for medication management from his cardiologist. Per PCP notes patient has recently been complaining of chest discomfort as well and I would like this to be addressed further prior to treating his ED.  Group home staff member reports that patient has an appointment with Dr. Clayborn Bigness in November.   Will schedule patient for follow up with [place] in December.  2. Prostate Cancer-  Follow up with [place] as scheduled.  CC: Dr. Clayborn Bigness  Return in about 3 months (around 06/08/2015) for recheck ED.  Herbert Moors, [facility] [address] [place], [place] [address] [phone]

## 2015-03-13 ENCOUNTER — Ambulatory Visit (INDEPENDENT_AMBULATORY_CARE_PROVIDER_SITE_OTHER): Payer: Medicaid Other | Admitting: Internal Medicine

## 2015-03-13 ENCOUNTER — Ambulatory Visit
Admission: RE | Admit: 2015-03-13 | Discharge: 2015-03-13 | Disposition: A | Discharge status: Home / Self Care | Payer: Medicaid Other | Source: Ambulatory Visit | Attending: Internal Medicine | Admitting: Internal Medicine

## 2015-03-13 ENCOUNTER — Encounter: Payer: Self-pay | Admitting: Internal Medicine

## 2015-03-13 ENCOUNTER — Encounter (INDEPENDENT_AMBULATORY_CARE_PROVIDER_SITE_OTHER): Payer: Self-pay

## 2015-03-13 VITALS — BP 126/82 | HR 85 | Ht 63.0 in | Wt 112.0 lb

## 2015-03-13 DIAGNOSIS — R0602 Shortness of breath: Secondary | ICD-10-CM | POA: Diagnosis not present

## 2015-03-13 DIAGNOSIS — I272 Pulmonary hypertension, unspecified: Secondary | ICD-10-CM

## 2015-03-13 DIAGNOSIS — I270 Primary pulmonary hypertension: Secondary | ICD-10-CM | POA: Diagnosis not present

## 2015-03-13 NOTE — Progress Notes (Signed)
[facility] Consultation      Assessment and Plan:  -Dyspnea and respiratory failure. Possibly secondary to pulmonary and cardiac disease. We'll check an overnight oxygen saturation test to look for evidence of nocturnal desats, if present, will start the patient on nocturnal oxygen. Given his cognitive dysfunction. I doubt that he would be able to tolerate CPAP, would therefore defer any testing for sleep apnea at this time.   -COPD-emphysema. Per history, I will check a full point function test and a 6 minute walk test. -Patient also has crackles on pulmonary exam today, he could have interstitial lung disease versus some degree of pulmonary edema. I will see with the results of his chest x-ray and Pulmicort function testing show, if necessary, we could consider checking a high resolution CT scan.  -Nicotine abuse. Discussed smoking cessation strategies today, though not sure that the patient is ready to quit at this time.  -Essential hypertension.  -Atrial fibrillation on anticoagulation.  -Pulmonary hypertension. Pulmonary systolic pressure of 60 mmHg noted on echocardiogram from 11/30/2013. Suspect that this is a either group 1 and/or group 2, secondary to cardiac and/or respiratory disease.     Date: 03/13/2015  MRN# [id number] Christopher Martinez Jul 01, 1956  Referring Physician: Ashok Norris  Christopher Martinez is a 59 y.o. old male seen in consultation for chief complaint of:    Chief Complaint  Patient presents with  . Advice Only    referred for pulm htn.  pt c/o sob Xfew months.      HPI:  Patient is a 59 year old male smoker, with a history of A-fib, kidney disease, CHF, hypertension, prostate cancer and hyperlipidemia, who is a resident at a group home presenting today as a referral from his primary care provider. His breathing in general is ok but he notices that it is difficult at night. He has noticed that his breathing at night has gotten better since  he has been using a few pillows.  He denies any reflux symptoms. He denies daytime sleepiness.  Goes to bed at 11; wakes at 5 am, sometimes does not feel rested. He does not snore at night.  He is currently smoking half a pack per day, he is not sure that he is ready to quit at this time.  The patient is a resident at a group home, he currently ambulates with a cane. He is not limited in his activities of daily living. He walks up and down the block around the group home. He tells me about 2-3 times a day. He also leaves the home with a supervisor and sometimes goes to the grocery store, he is able to ambulate in a supermarket with his cane without difficulty, and is not limited by dyspnea in this regard.  His baseline O2 sat was 95% and HR was 87 at rest at RA. He was ambulated for 700 feet with minimal dyspnea, sat was 94% and 108 at the end of the walk.  Echocardiogram results from 11/30/2013 reviewed. Ejection fraction was 55%, mild mitral regurgitation was noted. Moderate tricuspid regurgitation was noted, RV systolic pressure was 60 mmHg. There is no recent CXR , or PFT for review.    PMHX:   Past Medical History  Diagnosis Date  . Frequent headaches   . Chest pain   . Fall at home   . Femur fracture, right   . Hypertension   . Elevated troponin   . GERD (gastroesophageal reflux disease)   . Acute renal failure   .  Mild left ventricular systolic dysfunction   . Pulmonary hypertension   . A-fib   . Prostate cancer   . Schizophrenia   . Hyperlipidemia   . Severe protein-calorie malnutrition    Surgical Hx:  Past Surgical History  Procedure Laterality Date  . Orif right femur Right 07-29-2014   Family Hx:  No family history on file. Social Hx:   Social History  Substance Use Topics  . Smoking status: Current Every Day Smoker -- 1.00 packs/day    Types: Cigarettes  . Smokeless tobacco: Not on file  . Alcohol Use: No   Medication:   Current Outpatient Rx  Name  Route   Sig  Dispense  Refill  . acetaminophen (TYLENOL) 500 MG tablet   Oral   Take 1,000 mg by mouth every 6 (six) hours as needed.         [name] aspirin 81 MG tablet   Oral   Take 1 tablet (81 mg total) by mouth daily.   30 tablet   3   . diltiazem (DILACOR XR) 240 MG 24 hr capsule   Oral   Take 1 capsule (240 mg total) by mouth daily.   90 capsule   1   . enoxaparin (LOVENOX) 100 MG/ML injection   Subcutaneous   Inject 100 mg into the skin.         . fexofenadine (ALLEGRA) 180 MG tablet   Oral   Take 180 mg by mouth daily.         [name] LORazepam (ATIVAN) 0.5 MG tablet   Oral   Take 1 tablet (0.5 mg total) by mouth 2 (two) times daily as needed for anxiety.   30 tablet   1   . magnesium hydroxide (MILK OF MAGNESIA) 800 MG/5ML suspension   Oral   Take 30 mLs by mouth every 6 (six) hours as needed for constipation.         . metoprolol tartrate (LOPRESSOR) 25 MG tablet      TAKE 3 TABLETS (75MG ) BY MOUTH 2 TIMES A DAY   180 tablet   0   . nicotine (NICODERM CQ - DOSED IN MG/24 HOURS) 21 mg/24hr patch   Transdermal   Place 21 mg onto the skin daily.         [name] omeprazole (PRILOSEC) 20 MG capsule   Oral   Take 20 mg by mouth daily.         [name] oxyCODONE (OXY IR/ROXICODONE) 5 MG immediate release tablet   Oral   Take 5 mg by mouth every 4 (four) hours as needed for severe pain (Give 1 tab q4hrs prn moderate pain. May take 2 tabs q4h prn severe pain).         . ranitidine (ZANTAC) 150 MG tablet   Oral   Take 1 tablet (150 mg total) by mouth 2 (two) times daily.   60 tablet   3       Allergies:  Thorazine  Review of Systems: Gen:  Denies  fever, sweats, chills HEENT: Denies blurred vision, double vision. bleeds, sore throat Cvc:  No dizziness, chest pain. Resp:   Denies cough or sputum porduction, shortness of breath Gi: Denies swallowing difficulty, stomach pain. Gu:  Denies bladder incontinence, burning urine Ext:   No Joint pain, stiffness. Skin: No  skin rash,  hives Endoc:  No polyuria, polydipsia. Psych: No depression, insomnia. Other:  All other systems were reviewed with the patient and were negative other that what is  mentioned in the HPI.   Physical Examination:   VS: BP 126/82 mmHg  Pulse 85  Ht 5\' 3"  (1.6 m)  Wt 50.803 kg (112 lb)  BMI 19.84 kg/m2  SpO2 91%  General Appearance: No distress  Neuro:without focal findings,  speech normal,  HEENT: PERRLA, EOM intact.   Pulmonary: normal breath sounds, No wheezing.  CardiovascularNormal S1,S2.  No m/r/g.   Abdomen: Benign, Soft, non-tender. Renal:  No costovertebral tenderness  GU:  No performed at this time. Endoc: No evident thyromegaly, no signs of acromegaly. Skin:   warm, no rashes, no ecchymosis  Extremities: normal, no cyanosis, clubbing.  Other findings:    LABORATORY PANEL:   CBC  Recent Labs Lab 03/07/15 1246  WBC 10.9*  HCT 42.5   ------------------------------------------------------------------------------------------------------------------  Chemistries   Recent Labs Lab 03/07/15 1246  NA 137  K 5.1  CL 97  CO2 27  GLUCOSE 86  BUN 14  CREATININE 0.98  CALCIUM 9.5  AST 23  ALT 19  ALKPHOS 119*  BILITOT 0.3   ------------------------------------------------------------------------------------------------------------------    Thank  you for the consultation and for allowing [facility] to assist in the care of your patient. Our recommendations are noted above.  Please contact us if we can be of further service.   Marda Stalker, MD.  Board Certified in Internal Medicine, Pulmonary Medicine, [place], and Sleep Medicine.  [facility] Office Number: [phone]  Patricia Pesa, M.D.  Vilinda Boehringer, M.D.  Cheral Marker, M.D

## 2015-03-13 NOTE — Patient Instructions (Signed)
--  Continue to work on smoking cessation.  --Check full PFT, 6 minute walk test, overnight sat-gram on room air, 2 view CXR.  --Follow up after testing completed.

## 2015-03-15 ENCOUNTER — Encounter: Payer: Self-pay | Admitting: Internal Medicine

## 2015-03-21 ENCOUNTER — Other Ambulatory Visit: Payer: Self-pay | Admitting: Emergency Medicine

## 2015-03-21 MED ORDER — METOPROLOL TARTRATE 25 MG PO TABS: ORAL_TABLET | ORAL | Status: DC

## 2015-03-23 ENCOUNTER — Telehealth: Payer: Self-pay | Admitting: *Deleted

## 2015-03-23 DIAGNOSIS — J439 Emphysema, unspecified: Secondary | ICD-10-CM

## 2015-03-23 DIAGNOSIS — G4734 Idiopathic sleep related nonobstructive alveolar hypoventilation: Secondary | ICD-10-CM

## 2015-03-23 DIAGNOSIS — I272 Pulmonary hypertension, unspecified: Secondary | ICD-10-CM

## 2015-03-23 NOTE — Telephone Encounter (Signed)
New order placed for O2

## 2015-03-23 NOTE — Telephone Encounter (Signed)
-----   Message from Laverle Hobby, MD sent at 03/23/2015  2:52 PM EDT ----- Regarding: needs oxygen.  Pt's overnight oximetry reviewed, needs script for 2L of oxygen every night.

## 2015-03-24 ENCOUNTER — Other Ambulatory Visit: Payer: Self-pay | Admitting: Family Medicine

## 2015-04-18 ENCOUNTER — Ambulatory Visit (INDEPENDENT_AMBULATORY_CARE_PROVIDER_SITE_OTHER): Payer: Medicaid Other | Admitting: Internal Medicine

## 2015-04-18 ENCOUNTER — Ambulatory Visit: Payer: Self-pay | Admitting: Internal Medicine

## 2015-04-18 ENCOUNTER — Ambulatory Visit: Payer: Medicaid Other

## 2015-04-18 ENCOUNTER — Encounter: Payer: Self-pay | Admitting: Internal Medicine

## 2015-04-18 ENCOUNTER — Ambulatory Visit: Payer: Self-pay

## 2015-04-18 ENCOUNTER — Ambulatory Visit: Payer: Medicaid Other | Admitting: Internal Medicine

## 2015-04-18 VITALS — BP 122/90 | HR 74 | Ht 63.0 in | Wt 111.0 lb

## 2015-04-18 DIAGNOSIS — I272 Other secondary pulmonary hypertension: Secondary | ICD-10-CM | POA: Diagnosis not present

## 2015-04-18 DIAGNOSIS — J849 Interstitial pulmonary disease, unspecified: Secondary | ICD-10-CM | POA: Diagnosis not present

## 2015-04-18 DIAGNOSIS — G4734 Idiopathic sleep related nonobstructive alveolar hypoventilation: Secondary | ICD-10-CM

## 2015-04-18 DIAGNOSIS — J439 Emphysema, unspecified: Secondary | ICD-10-CM

## 2015-04-18 DIAGNOSIS — R0600 Dyspnea, unspecified: Secondary | ICD-10-CM | POA: Diagnosis not present

## 2015-04-18 LAB — PULMONARY FUNCTION TEST
DL/VA % PRED: 44 %
DL/VA: 1.82 ml/min/mmHg/L
DLCO UNC % PRED: 44 %
DLCO unc: 10.21 ml/min/mmHg
FEF 25-75 PRE: 0.72 L/s
FEF 25-75 Post: 0.88 L/sec
FEF2575-%Change-Post: 23 %
FEF2575-%PRED-POST: 39 %
FEF2575-%Pred-Pre: 32 %
FEV1-%Change-Post: 8 %
FEV1-%Pred-Post: 69 %
FEV1-%Pred-Pre: 64 %
FEV1-POST: 1.61 L
FEV1-Pre: 1.49 L
FEV1FVC-%Change-Post: 6 %
FEV1FVC-%Pred-Pre: 77 %
FEV6-%CHANGE-POST: 2 %
FEV6-%PRED-PRE: 85 %
FEV6-%Pred-Post: 86 %
FEV6-PRE: 2.44 L
FEV6-Post: 2.49 L
FEV6FVC-%Change-Post: 0 %
FEV6FVC-%PRED-PRE: 104 %
FEV6FVC-%Pred-Post: 104 %
FVC-%CHANGE-POST: 1 %
FVC-%PRED-POST: 83 %
FVC-%Pred-Pre: 81 %
FVC-Post: 2.5 L
FVC-Pre: 2.46 L
POST FEV1/FVC RATIO: 64 %
Post FEV6/FVC ratio: 99 %
Pre FEV1/FVC ratio: 61 %
Pre FEV6/FVC Ratio: 99 %

## 2015-04-18 MED ORDER — FLUTICASONE-SALMETEROL 100-50 MCG/DOSE IN AEPB: 1.0000 | INHALATION_SPRAY | Freq: Two times a day (BID) | RESPIRATORY_TRACT | Status: DC

## 2015-04-18 NOTE — Addendum Note (Signed)
Addended by: Oscar La R on: 04/18/2015 12:12 PM   Modules accepted: Orders

## 2015-04-18 NOTE — Patient Instructions (Signed)
--  Start advair 100, 1 puff twice daily, rinse mouth after use.

## 2015-04-18 NOTE — Progress Notes (Signed)
PFT performed today. DLCO and Lung Volumes could not be calculated due to the pt being unable to perform the tests and meet ATS standards.

## 2015-04-18 NOTE — Progress Notes (Signed)
[facility] Consultation      Assessment and Plan:  -Dyspnea and respiratory failure. Possibly secondary to pulmonary and cardiac disease. I suspect he has a mixture of moderate COPD as well as some degree of interstitial lung disease.    -COPD-emphysema. Will start advair 100 bid.  Continue nocturnal oxygen.  Discussed smoking cessation.   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 Given comorbidities, currentt smoking, poor functional status and cognitive dysfunction I do not think that he would be a good candidate for therapy for this, therefore will defer CT scanning at this time.   -Nicotine abuse. Discussed smoking cessation strategies today, though not sure that the patient is ready to quit at this time.  -Essential hypertension.  -Atrial fibrillation on anticoagulation.  -Pulmonary hypertension. Pulmonary systolic pressure of 60 mmHg noted on echocardiogram from 11/30/2013. Suspect that this is a either group 2 and/or group 3, secondary to cardiac and/or respiratory disease.    Date: 04/18/2015  MRN# [id number] Christopher Martinez [birth year]  Referring Physician: Ashok Norris  Christopher Martinez is a 59 y.o. old male seen in consultation for chief complaint of:    Chief Complaint  Patient presents with  . Follow-up    PFT/SMW results. no new complants.    HPI:  Patient is a 59 year old male smoker, with a history of A-fib, kidney disease, CHF, hypertension, prostate cancer and hyperlipidemia, who is a resident at a group home. We are following him for COPD and possible interstitial lung disease, he also has a history of smoking and pulmonary hypertension.  He is still smoking about a pack per day, not sure that he is ready to quit.    Review of testing: -Chest x-ray 03/13/2015: Mild hyperinflation, bibasilar interstitial changes. -Pulmonary function test 04/18/2015: 10 shows moderate obstructive lung disease with an FEV1 of 64% of predicted, there  is no change with proper dilator therapy. Expiratory reserve volume was elevated at 329%, suggesting air trapping, DLCO was reduced at 44%. -6 minute walk test, mild dyspnea, but pt limited by leg pain and poor gait.  -Overnight oximetry was performed on 03/15/2015: The patient spent 1 hour 32 minutes below 80, oxygen saturation 88% or less; it was recommended that he be put on 2 L of oxygen at night. -His baseline O2 sat was 95% and HR was 87 at rest at RA. He was ambulated for 700 feet with minimal dyspnea, sat was 94% and 108 at the end of the walk. -Echocardiogram results from 11/30/2013 reviewed. Ejection fraction was 55%, mild mitral regurgitation was noted. Moderate tricuspid regurgitation was noted, RV systolic pressure was 60 mmHg. There is no recent CXR , or PFT for review.    Medication:   Current Outpatient Rx  Name  Route  Sig  Dispense  Refill  . acetaminophen (TYLENOL) 500 MG tablet   Oral   Take 1,000 mg by mouth every 6 (six) hours as needed.         [name] aspirin 81 MG tablet   Oral   Take 1 tablet (81 mg total) by mouth daily.   30 tablet   3   . diltiazem (DILACOR XR) 240 MG 24 hr capsule   Oral   Take 1 capsule (240 mg total) by mouth daily.   90 capsule   1   . enoxaparin (LOVENOX) 100 MG/ML injection   Subcutaneous   Inject 100 mg into the skin.         . fexofenadine (ALLEGRA) 180  MG tablet   Oral   Take 180 mg by mouth daily.         [name] LORazepam (ATIVAN) 0.5 MG tablet   Oral   Take 1 tablet (0.5 mg total) by mouth 2 (two) times daily as needed for anxiety.   30 tablet   1   . magnesium hydroxide (MILK OF MAGNESIA) 800 MG/5ML suspension   Oral   Take 30 mLs by mouth every 6 (six) hours as needed for constipation.         . metoprolol tartrate (LOPRESSOR) 25 MG tablet      TAKE 3 TABLETS (75MG ) BY MOUTH 2 TIMES A DAY   180 tablet   0   . nicotine (NICODERM CQ - DOSED IN MG/24 HOURS) 21 mg/24hr patch   Transdermal   Place 21 mg onto the  skin daily.         [name] omeprazole (PRILOSEC) 20 MG capsule   Oral   Take 20 mg by mouth daily.         [name] oxyCODONE (OXY IR/ROXICODONE) 5 MG immediate release tablet   Oral   Take 5 mg by mouth every 4 (four) hours as needed for severe pain (Give 1 tab q4hrs prn moderate pain. May take 2 tabs q4h prn severe pain).         . ranitidine (ZANTAC) 150 MG tablet   Oral   Take 1 tablet (150 mg total) by mouth 2 (two) times daily.   60 tablet   3       Allergies:  Thorazine  Review of Systems: Gen:  Denies  fever, sweats, chills HEENT: Denies blurred vision, Cvc:  No dizziness, chest pain. Resp:   Denies cough or sputum porduction, shortness of breath Gi: Denies swallowing difficulty, stomach pain. Gu:  Denies bladder incontinence, burning urine Ext:   No Joint pain, stiffness. Skin: No skin rash,  hives Endoc:  No polyuria, polydipsia. Psych: No depression, insomnia. Other:  All other systems were reviewed with the patient and were negative other that what is mentioned in the HPI.   Physical Examination:   VS: BP 122/90 mmHg  Pulse 74  Ht 5\' 3"  (1.6 m)  Wt 111 lb (50.349 kg)  BMI 19.67 kg/m2  SpO2 97%  General Appearance: No distress  Neuro:without focal findings,  speech normal,  HEENT: PERRLA, EOM intact.   Pulmonary: Bibasilar crackles.  CardiovascularNormal S1,S2.  No m/r/g.   Abdomen: Benign, Soft, non-tender. Renal:  No costovertebral tenderness  GU:  No performed at this time. Endoc: No evident thyromegaly, no signs of acromegaly. Skin:   warm, no rashes, no ecchymosis  Extremities: normal, no cyanosis, clubbing.  Other findings:    LABORATORY PANEL:   CBC No results for input(s): WBC, HGB, HCT, PLT in the last 168 hours. ------------------------------------------------------------------------------------------------------------------  Chemistries  No results for input(s): NA, K, CL, CO2, GLUCOSE, BUN, CREATININE, CALCIUM, MG, AST, ALT, ALKPHOS,  BILITOT in the last 168 hours.  Invalid input(s): GFRCGP ------------------------------------------------------------------------------------------------------------------    Thank  you for the consultation and for allowing [facility] to assist in the care of your patient. Our recommendations are noted above.  Please contact us if we can be of further service.   Marda Stalker, MD.  Board Certified in Internal Medicine, Pulmonary Medicine, [place], and Sleep Medicine.  [facility] Office Number: [phone]  Patricia Pesa, M.D.  Vilinda Boehringer, M.D.  Cheral Marker, M.D

## 2015-04-24 ENCOUNTER — Inpatient Hospital Stay: Payer: Medicaid Other | Attending: Radiation Oncology

## 2015-04-24 ENCOUNTER — Encounter: Payer: Self-pay | Admitting: Radiation Oncology

## 2015-04-24 ENCOUNTER — Ambulatory Visit
Admission: RE | Admit: 2015-04-24 | Discharge: 2015-04-24 | Disposition: A | Discharge status: Home / Self Care | Payer: Medicaid Other | Source: Ambulatory Visit | Attending: Radiation Oncology | Admitting: Radiation Oncology

## 2015-04-24 ENCOUNTER — Other Ambulatory Visit: Payer: Self-pay | Admitting: *Deleted

## 2015-04-24 VITALS — BP 139/95 | HR 117 | Temp 95.1°F | Resp 20 | Wt 107.9 lb

## 2015-04-24 DIAGNOSIS — C61 Malignant neoplasm of prostate: Secondary | ICD-10-CM

## 2015-04-24 DIAGNOSIS — Z8546 Personal history of malignant neoplasm of prostate: Secondary | ICD-10-CM | POA: Insufficient documentation

## 2015-04-24 DIAGNOSIS — Z923 Personal history of irradiation: Secondary | ICD-10-CM | POA: Insufficient documentation

## 2015-04-24 DIAGNOSIS — Z08 Encounter for follow-up examination after completed treatment for malignant neoplasm: Secondary | ICD-10-CM | POA: Insufficient documentation

## 2015-04-24 LAB — PSA: PSA: 6.42 ng/mL — ABNORMAL HIGH (ref 0.00–4.00)

## 2015-04-24 NOTE — Progress Notes (Signed)
Radiation Oncology Follow up Note  Name: Christopher Martinez   Date:   04/24/2015 MRN:  [id number] DOB: 09-May-1956    This 59 y.o. male presents to the clinic today for follow-up for prostate cancer now 7 years out.  REFERRING PROVIDER: Ashok Norris, MD  HPI: Patient is a 59 year old male now out over 7 years having completed radiation therapy for Gleason 7 (3+4) adenocarcinoma the prostate with initial PSA of 48. His PSA has been declining. Last PSA in April 2016 was 4.7 and stable. He specifically denies any lower urinary tract symptoms or diarrhea..  COMPLICATIONS OF TREATMENT: none  FOLLOW UP COMPLIANCE: keeps appointments   PHYSICAL EXAM:  BP 139/95 mmHg  Pulse 117  Temp(Src) 95.1 F (35.1 C)  Resp 20  Wt 107 lb 14.6 oz (48.95 kg) On rectal exam rectal sphincter tone is good. Prostate is smooth contracted without evidence of nodularity or mass. Sulcus is preserved bilaterally. No discrete nodularity is identified. No other rectal abnormalities are noted. Well-developed well-nourished patient in NAD. HEENT reveals PERLA, EOMI, discs not visualized.  Oral cavity is clear. No oral mucosal lesions are identified. Neck is clear without evidence of cervical or supraclavicular adenopathy. Lungs are clear to A&P. Cardiac examination is essentially unremarkable with regular rate and rhythm without murmur rub or thrill. Abdomen is benign with no organomegaly or masses noted. Motor sensory and DTR levels are equal and symmetric in the upper and lower extremities. Cranial nerves II through XII are grossly intact. Proprioception is intact. No peripheral adenopathy or edema is identified. No motor or sensory levels are noted. Crude visual fields are within normal range.  RADIOLOGY RESULTS: No current films for review  PLAN: Present time I've run another PSA level on him today and report that separately. Otherwise I'm please was overall progress. Should his PSA show significant acceleration may opt to  pulse with Lupron therapy. Will make that determination after values are received. Patient will see [place] again in 1 year for follow-up or sooner depending on PSA.  I would like to take this opportunity for allowing me to participate in the care of your patient.Armstead Peaks., MD

## 2015-04-25 ENCOUNTER — Other Ambulatory Visit: Payer: Self-pay

## 2015-04-25 MED ORDER — METOPROLOL TARTRATE 25 MG PO TABS: ORAL_TABLET | ORAL | Status: DC

## 2015-04-25 MED ORDER — DILTIAZEM HCL ER 240 MG PO CP24: 240.0000 mg | ORAL_CAPSULE | Freq: Every day | ORAL | Status: DC

## 2015-04-28 ENCOUNTER — Encounter: Payer: Self-pay | Admitting: Sports Medicine

## 2015-04-28 ENCOUNTER — Ambulatory Visit (INDEPENDENT_AMBULATORY_CARE_PROVIDER_SITE_OTHER): Payer: Medicaid Other | Admitting: Sports Medicine

## 2015-04-28 DIAGNOSIS — M79604 Pain in right leg: Secondary | ICD-10-CM

## 2015-04-28 DIAGNOSIS — B351 Tinea unguium: Secondary | ICD-10-CM

## 2015-04-28 DIAGNOSIS — M79674 Pain in right toe(s): Secondary | ICD-10-CM

## 2015-04-28 DIAGNOSIS — Q828 Other specified congenital malformations of skin: Secondary | ICD-10-CM | POA: Diagnosis not present

## 2015-04-28 DIAGNOSIS — M79673 Pain in unspecified foot: Secondary | ICD-10-CM | POA: Diagnosis not present

## 2015-04-28 DIAGNOSIS — M79605 Pain in left leg: Secondary | ICD-10-CM

## 2015-04-28 NOTE — Progress Notes (Signed)
Patient ID: Jerman Tinnon, male   DOB: [birth year], 59 y.o.   MRN: [id number] Subjective: Christopher Martinez is a 59 y.o. male patient seen today in office with complaint of callus and thickened and elongated toenails; unable to trim. Patient denies history of Diabetes, Neuropathy, or Vascular disease. Patient has no other pedal complaints at this time.   Patient Active Problem List   Diagnosis Date Noted  . CAFL (chronic airflow limitation) ([place]) 03/07/2015  . Hyperlipidemia 03/07/2015  . Cancer of ureter ([place]) 11/08/2014  . Essential hypertension 08/09/2014  . Acute renal failure ([place]) 08/09/2014  . Moderate tricuspid regurgitation 08/09/2014  . Mild left ventricular systolic dysfunction 82/42/3536  . Mild pulmonary hypertension ([place]) 08/09/2014  . Personal history of prostate cancer 08/09/2014  . A-fib ([place]) 08/05/2014  . Closed fracture of right distal femur ([place]) 08/05/2014  . GERD (gastroesophageal reflux disease) 08/05/2014  . Severe protein-calorie malnutrition ([place]) 08/05/2014  . Abnormal ECG 02/15/2014  . Cardiac conduction disorder 02/15/2014  . Heart & renal disease, hypertensive, with heart failure ([place]) 02/15/2014  . Acute exacerbation of chronic obstructive airways disease ([place]) 02/15/2014  . Benign essential HTN 12/01/2006   Current Outpatient Prescriptions on File Prior to Visit  Medication Sig Dispense Refill  . acetaminophen (TYLENOL) 500 MG tablet Take 1,000 mg by mouth every 6 (six) hours as needed.    [name] aspirin 81 MG tablet Take 1 tablet (81 mg total) by mouth daily. 30 tablet 3  . diltiazem (DILACOR XR) 240 MG 24 hr capsule Take 1 capsule (240 mg total) by mouth daily. 90 capsule 1  . enoxaparin (LOVENOX) 100 MG/ML injection Inject 100 mg into the skin.    . fexofenadine (ALLEGRA) 180 MG tablet Take 180 mg by mouth daily.    . Fluticasone-Salmeterol (ADVAIR) 100-50 MCG/DOSE AEPB Inhale 1 puff into the lungs 2 (two) times daily. 14 each 0  . Fluticasone-Salmeterol (ADVAIR)  100-50 MCG/DOSE AEPB Inhale 1 puff into the lungs 2 (two) times daily. 60 each 5  . LORazepam (ATIVAN) 0.5 MG tablet Take 1 tablet (0.5 mg total) by mouth 2 (two) times daily as needed for anxiety. 30 tablet 1  . magnesium hydroxide (MILK OF MAGNESIA) 800 MG/5ML suspension Take 30 mLs by mouth every 6 (six) hours as needed for constipation.    . metoprolol tartrate (LOPRESSOR) 25 MG tablet TAKE 3 TABLETS (75MG ) BY MOUTH 2 TIMES A DAY 180 tablet 1  . nicotine (NICODERM CQ - DOSED IN MG/24 HOURS) 21 mg/24hr patch Place 21 mg onto the skin daily.    [name] omeprazole (PRILOSEC) 20 MG capsule Take 20 mg by mouth daily.    [name] oxyCODONE (OXY IR/ROXICODONE) 5 MG immediate release tablet Take 5 mg by mouth every 4 (four) hours as needed for severe pain (Give 1 tab q4hrs prn moderate pain. May take 2 tabs q4h prn severe pain).    . ranitidine (ZANTAC) 150 MG tablet Take 1 tablet (150 mg total) by mouth 2 (two) times daily. 60 tablet 3   No current facility-administered medications on file prior to visit.   Allergies  Allergen Reactions  . Thorazine [Chlorpromazine]     Objective: Physical Exam  General: Well developed, nourished, no acute distress, awake, alert and oriented x 3  Vascular: Dorsalis pedis artery 2/4 bilateral, Posterior tibial artery 1/4 bilateral, skin temperature warm to warm proximal to distal bilateral lower extremities, no varicosities, scant pedal hair present bilateral.  Neurological: Gross sensation present via light touch bilateral.   Dermatological:  Skin is warm, dry, and supple bilateral, Nails 1-10 are tender, long, thick, and discolored with mild subungal debris, no webspace macerations present bilateral, no open lesions present bilateral, Porokeratosis/ keratotic lesion with nucleated core present sub 5 bilateral with no signs of infection bilateral.  Musculoskeletal: Bunion and hammertoes noted bilateral. Muscular strength within normal limits without pain or limitation on  range of motion. No pain with calf compression bilateral.  Assessment and Plan:  Problem List Items Addressed This Visit    None    Visit Diagnoses    Dermatophytosis of nail    -  Primary    Porokeratosis        Pain in both lower extremities           -Examined patient.  -Discussed treatment options for painful mycotic nails and porokeratosis. -Mechanically debrided and reduced mycotic nails with sterile nail nipper and dremel nail file without incident. -Debrided porokeratosis x 2 using sterile chisel blade without incident.  -Recommend skin emollients for dry skin daily.  -Recommend good supportive shoes daily.  -Patient to return in 3 months for follow up evaluation or sooner if symptoms worsen.  Landis Martins, DPM

## 2015-05-09 ENCOUNTER — Ambulatory Visit: Payer: Medicaid Other

## 2015-05-09 ENCOUNTER — Ambulatory Visit: Payer: Medicaid Other | Admitting: Sports Medicine

## 2015-05-16 ENCOUNTER — Other Ambulatory Visit: Payer: Self-pay

## 2015-05-16 MED ORDER — FEXOFENADINE HCL 180 MG PO TABS: 180.0000 mg | ORAL_TABLET | Freq: Every day | ORAL | Status: DC

## 2015-06-07 ENCOUNTER — Telehealth: Payer: Self-pay | Admitting: Family Medicine

## 2015-06-07 NOTE — Telephone Encounter (Signed)
[facility] is doing a medication clean up list and ould like to know if ranitidine 150 mg one tablet 2 times daily is on the patient active medication list. Please return call tomorrow [phone]

## 2015-06-08 ENCOUNTER — Ambulatory Visit (INDEPENDENT_AMBULATORY_CARE_PROVIDER_SITE_OTHER): Payer: Medicaid Other | Admitting: Obstetrics and Gynecology

## 2015-06-08 ENCOUNTER — Telehealth: Payer: Self-pay | Admitting: Obstetrics and Gynecology

## 2015-06-08 ENCOUNTER — Telehealth: Payer: Self-pay | Admitting: Family Medicine

## 2015-06-08 ENCOUNTER — Encounter: Payer: Self-pay | Admitting: Obstetrics and Gynecology

## 2015-06-08 VITALS — BP 137/93 | HR 90 | Resp 16 | Ht 63.0 in | Wt 110.9 lb

## 2015-06-08 DIAGNOSIS — N529 Male erectile dysfunction, unspecified: Secondary | ICD-10-CM

## 2015-06-08 NOTE — Progress Notes (Signed)
06/08/2015 11:51 AM   Beckey Rutter [birth year] [id number]  Referring provider: Ashok Norris, MD [address] [place], [place] [address]  Chief Complaint  Patient presents with  . Erectile Dysfunction  . Follow-up    HPI: Patient is a 59 year old male, with a history of A-fib, kidney disease, CHF, hypertension, prostate cancer and hyperlipidemia, who is a resident at a group home presenting today as a referral from his primary care provider for erectile dysfunction. Patient reports he is experienced this problem for approximately a year and a half that recently became more bothered by it when he met a new sexual partner and was unable to achieve an erection. Treatment for his prostate cancer has included external beam radiation therapy as well as radioactive seed placement. Followed by Dr. Donella Stade at the [facility] center. He has a significant cardiac history and is currently on anticoagulation therapy. His cardiologist is Dr. Clayborn Bigness @ [facility]. He has also recurrent every day smoker 1 pack per day 45 years. He denies any urinary symptoms including frequency, gross hematuria, hesitancy, or intermittent stream.    Interval history Patient has been seen by his cardiologist at clinic. He did not discuss erectile dysfunction medications with his cardiologist as previously advised. He continues to desire treatment for his erectile dysfunction that he states that he does not currently have a girlfriend.  PMH: Past Medical History  Diagnosis Date  . Frequent headaches   . Chest pain   . Fall at home   . Femur fracture, right ([place])   . Hypertension   . Elevated troponin   . GERD (gastroesophageal reflux disease)   . Acute renal failure ([place])   . Mild left ventricular systolic dysfunction   . Pulmonary hypertension ([place])   . A-fib ([place])   . Prostate cancer ([place])   . Schizophrenia ([place])   . Hyperlipidemia   . Severe protein-calorie malnutrition [facility])     Surgical History: Past  Surgical History  Procedure Laterality Date  . Orif right femur Right 07-29-2014    Home Medications:    Medication List       This list is accurate as of: 06/08/15 11:51 AM.  Always use your most recent med list.               acetaminophen 500 MG tablet  Commonly known as:  TYLENOL  Take 1,000 mg by mouth every 6 (six) hours as needed.     aspirin 81 MG tablet  Take 1 tablet (81 mg total) by mouth daily.     diltiazem 240 MG 24 hr capsule  Commonly known as:  DILACOR XR  Take 1 capsule (240 mg total) by mouth daily.     enoxaparin 100 MG/ML injection  Commonly known as:  LOVENOX  Inject 100 mg into the skin.     fexofenadine 180 MG tablet  Commonly known as:  ALLEGRA  Take 1 tablet (180 mg total) by mouth daily.     Fluticasone-Salmeterol 100-50 MCG/DOSE Aepb  Commonly known as:  ADVAIR  Inhale 1 puff into the lungs 2 (two) times daily.     Fluticasone-Salmeterol 100-50 MCG/DOSE Aepb  Commonly known as:  ADVAIR  Inhale 1 puff into the lungs 2 (two) times daily.     LORazepam 0.5 MG tablet  Commonly known as:  ATIVAN  Take 1 tablet (0.5 mg total) by mouth 2 (two) times daily as needed for anxiety.     magnesium hydroxide 800 MG/5ML suspension  Commonly known  as:  MILK OF MAGNESIA  Take 30 mLs by mouth every 6 (six) hours as needed for constipation.     metoprolol tartrate 25 MG tablet  Commonly known as:  LOPRESSOR  TAKE 3 TABLETS (75MG) BY MOUTH 2 TIMES A DAY     nicotine 21 mg/24hr patch  Commonly known as:  NICODERM CQ - dosed in mg/24 hours  Place 21 mg onto the skin daily.     omeprazole 20 MG capsule  Commonly known as:  PRILOSEC  Take 20 mg by mouth daily.     oxyCODONE 5 MG immediate release tablet  Commonly known as:  Oxy IR/ROXICODONE  Take 5 mg by mouth every 4 (four) hours as needed for severe pain (Give 1 tab q4hrs prn moderate pain. May take 2 tabs q4h prn severe pain).     ranitidine 150 MG tablet  Commonly known as:  ZANTAC  Take 1  tablet (150 mg total) by mouth 2 (two) times daily.        Allergies:  Allergies  Allergen Reactions  . Thorazine [Chlorpromazine]     Family History: No family history on file.  Social History:  reports that he has been smoking Cigarettes.  He has a 46 pack-year smoking history. He has never used smokeless tobacco. He reports that he does not drink alcohol or use illicit drugs.  ROS: UROLOGY Frequent Urination?: No Hard to postpone urination?: No Burning/pain with urination?: No Get up at night to urinate?: Yes Leakage of urine?: No Urine stream starts and stops?: No Trouble starting stream?: No Do you have to strain to urinate?: No Blood in urine?: No Urinary tract infection?: No Sexually transmitted disease?: No Injury to kidneys or bladder?: No Painful intercourse?: No Weak stream?: No Erection problems?: Yes Penile pain?: No  Gastrointestinal Nausea?: No Vomiting?: No Indigestion/heartburn?: No Diarrhea?: No Constipation?: No  Constitutional Fever: No Night sweats?: No Weight loss?: No Fatigue?: No  Skin Skin rash/lesions?: No Itching?: No  Eyes Blurred vision?: No Double vision?: No  Ears/Nose/Throat Sore throat?: No Sinus problems?: No  Hematologic/Lymphatic Swollen glands?: No Easy bruising?: No  Cardiovascular Leg swelling?: No Chest pain?: No  Respiratory Cough?: No Shortness of breath?: No  Endocrine Excessive thirst?: No  Musculoskeletal Back pain?: No Joint pain?: No  Neurological Headaches?: No Dizziness?: No  Psychologic Depression?: No Anxiety?: No  Physical Exam: BP 137/93 mmHg  Pulse 90  Resp 16  Ht 5' 3"  (1.6 m)  Wt 110 lb 14.4 oz (50.304 kg)  BMI 19.65 kg/m2  Constitutional:  Alert and oriented, No acute distress. HEENT: [place] AT, moist mucus membranes.  Trachea midline, no masses. Cardiovascular: No clubbing, cyanosis, or edema. Respiratory: Normal respiratory effort, no increased work of  breathing. Skin: No rashes, bruises or suspicious lesions. Neurologic: Grossly intact, no focal deficits, moving all 4 extremities. Psychiatric: Normal mood and affect.  Laboratory Data:    Pertinent Imaging:   Assessment & Plan:    1. Erectile Dysfunction- Patient does have a significant cardiac history including CHF and atrial fibrillation not currently on anticoagulant therapy due to noncompliance issues. I will send a clearance requested for patient to start PDE 5 inhibitors to his cardiologist. I do not feel comfortable prescribing this medication without clearance by patient's cardiologist. I discussed potential side effects and risks of these medications with the patient and he states understanding. I recommended that patient either tried to quit work significantly back on smoking and this may improve his erectile dysfunction.  There  are no diagnoses linked to this encounter.  Return in about 4 months (around 10/07/2015).  These notes generated with voice recognition software. I apologize for typographical errors.  Herbert Moors, [facility] [address] [place], [place] [address] [phone]

## 2015-06-08 NOTE — Telephone Encounter (Signed)
Pharmacy noted Ranitidine is on patient list as twice a day

## 2015-06-08 NOTE — Telephone Encounter (Signed)
Pt informed

## 2015-06-08 NOTE — Telephone Encounter (Signed)
Received form from pharmacy and it was signed and faxed back today.

## 2015-06-08 NOTE — Telephone Encounter (Signed)
Pt needs refill Adavain to be called into [facility]

## 2015-06-08 NOTE — Telephone Encounter (Signed)
Will you please send a note to Dr. Clayborn Bigness for clearance for patient to take PDE5 inhibitors for erectile dysfunction and/or other recommendations.

## 2015-06-12 ENCOUNTER — Other Ambulatory Visit: Payer: Self-pay

## 2015-06-12 MED ORDER — METOPROLOL TARTRATE 25 MG PO TABS: ORAL_TABLET | ORAL | Status: DC

## 2015-06-19 ENCOUNTER — Other Ambulatory Visit: Payer: Self-pay | Admitting: Family Medicine

## 2015-07-10 ENCOUNTER — Ambulatory Visit: Payer: Medicaid Other | Admitting: Family Medicine

## 2015-07-14 ENCOUNTER — Ambulatory Visit (INDEPENDENT_AMBULATORY_CARE_PROVIDER_SITE_OTHER): Payer: Medicaid Other | Admitting: Family Medicine

## 2015-07-14 ENCOUNTER — Encounter: Payer: Self-pay | Admitting: Family Medicine

## 2015-07-14 VITALS — BP 112/60 | HR 109 | Temp 98.0°F | Resp 18 | Wt 110.6 lb

## 2015-07-14 DIAGNOSIS — I480 Paroxysmal atrial fibrillation: Secondary | ICD-10-CM | POA: Diagnosis not present

## 2015-07-14 DIAGNOSIS — K219 Gastro-esophageal reflux disease without esophagitis: Secondary | ICD-10-CM | POA: Diagnosis not present

## 2015-07-14 DIAGNOSIS — J431 Panlobular emphysema: Secondary | ICD-10-CM

## 2015-07-14 DIAGNOSIS — J449 Chronic obstructive pulmonary disease, unspecified: Secondary | ICD-10-CM | POA: Insufficient documentation

## 2015-07-14 DIAGNOSIS — I272 Other secondary pulmonary hypertension: Secondary | ICD-10-CM

## 2015-07-14 DIAGNOSIS — I519 Heart disease, unspecified: Secondary | ICD-10-CM

## 2015-07-14 DIAGNOSIS — Z8546 Personal history of malignant neoplasm of prostate: Secondary | ICD-10-CM | POA: Diagnosis not present

## 2015-07-14 DIAGNOSIS — I130 Hypertensive heart and chronic kidney disease with heart failure and stage 1 through stage 4 chronic kidney disease, or unspecified chronic kidney disease: Secondary | ICD-10-CM | POA: Diagnosis not present

## 2015-07-14 DIAGNOSIS — E785 Hyperlipidemia, unspecified: Secondary | ICD-10-CM

## 2015-07-14 DIAGNOSIS — I10 Essential (primary) hypertension: Secondary | ICD-10-CM

## 2015-07-14 DIAGNOSIS — E46 Unspecified protein-calorie malnutrition: Secondary | ICD-10-CM

## 2015-07-14 DIAGNOSIS — I5189 Other ill-defined heart diseases: Secondary | ICD-10-CM

## 2015-07-14 DIAGNOSIS — N179 Acute kidney failure, unspecified: Secondary | ICD-10-CM

## 2015-07-14 DIAGNOSIS — S72401K Unspecified fracture of lower end of right femur, subsequent encounter for closed fracture with nonunion: Secondary | ICD-10-CM

## 2015-07-14 DIAGNOSIS — I509 Heart failure, unspecified: Secondary | ICD-10-CM

## 2015-07-14 DIAGNOSIS — N189 Chronic kidney disease, unspecified: Secondary | ICD-10-CM

## 2015-07-14 NOTE — Progress Notes (Signed)
Name: Christopher Martinez   MRN: [id number]    DOB: 1956-06-20   Date:07/14/2015       Progress Note  Subjective  Chief Complaint  Chief Complaint  Patient presents with  . Atrial Fibrillation    pt here for 4 month follow up  . Hypertension    HPI  Hypertension   Patient presents for follow-up of hypertension. It has been present for over over 5 years.  Patient states that there is compliance with medical regimen which consists of diltiazem XRT 240 mg daily metoprolol 25 mg daily . There is no end organ disease. Cardiac risk factors include hypertension hyperlipidemia and diabetes.  Exercise regimen consist of minimal walking .  Diet consist of some salt restriction .  COPD  Patient continues to smoke about a pack of cigarettes per day. He is on Advair on a regular basis. No rescue inhaler usage of usage.symptoms are mild.  Atrial fibrillation  History of atrial fibrillation which she is in the care of cardiologist and is on diltiazem.   ASCVD with CHF  Denies chest pain or shortness of breath or irregular heartbeat currently. Has a several year history of ASCVD with atrial fibrillation. No history of definite hasn't anginal symptoms. He's, followed by cardiologist on a regular basis.  Malnutrition  Patient's BMI is 18. He has a situation which she lives alone with some cognitive impairment but has some assistance with meals. Is not taking any supplements and      Past Medical History  Diagnosis Date  . Frequent headaches   . Chest pain   . Fall at home   . Femur fracture, right ([place])   . Hypertension   . Elevated troponin   . GERD (gastroesophageal reflux disease)   . Acute renal failure ([place])   . Mild left ventricular systolic dysfunction   . Pulmonary hypertension ([place])   . A-fib ([place])   . Prostate cancer ([place])   . Schizophrenia ([place])   . Hyperlipidemia   . Severe protein-calorie malnutrition ([place])     Social History  Substance Use Topics  . Smoking status:  Current Every Day Smoker -- 1.00 packs/day for 46 years    Types: Cigarettes  . Smokeless tobacco: Never Used     Comment: down to /5ppd per pt 03/13/15  . Alcohol Use: No     Current outpatient prescriptions:  .  acetaminophen (TYLENOL) 500 MG tablet, Take 1,000 mg by mouth every 6 (six) hours as needed., Disp: , Rfl:  .  aspirin 81 MG tablet, Take 1 tablet (81 mg total) by mouth daily., Disp: 30 tablet, Rfl: 3 .  diltiazem (DILACOR XR) 240 MG 24 hr capsule, Take 1 capsule (240 mg total) by mouth daily., Disp: 90 capsule, Rfl: 1 .  enoxaparin (LOVENOX) 100 MG/ML injection, Inject 100 mg into the skin., Disp: , Rfl:  .  fexofenadine (ALLEGRA) 180 MG tablet, TAKE ONE TABLET BY MOUTH EVERY DAY, Disp: 10 tablet, Rfl: 0 .  Fluticasone-Salmeterol (ADVAIR) 100-50 MCG/DOSE AEPB, Inhale 1 puff into the lungs 2 (two) times daily., Disp: 14 each, Rfl: 0 .  Fluticasone-Salmeterol (ADVAIR) 100-50 MCG/DOSE AEPB, Inhale 1 puff into the lungs 2 (two) times daily., Disp: 60 each, Rfl: 5 .  LORazepam (ATIVAN) 0.5 MG tablet, Take 1 tablet (0.5 mg total) by mouth 2 (two) times daily as needed for anxiety., Disp: 30 tablet, Rfl: 1 .  magnesium hydroxide (MILK OF MAGNESIA) 800 MG/5ML suspension, Take 30 mLs by mouth every 6 (six)  hours as needed for constipation., Disp: , Rfl:  .  metoprolol tartrate (LOPRESSOR) 25 MG tablet, TAKE 3 TABLETS (75MG ) BY MOUTH 2 TIMES A DAY, Disp: 180 tablet, Rfl: 1 .  nicotine (NICODERM CQ - DOSED IN MG/24 HOURS) 21 mg/24hr patch, Place 21 mg onto the skin daily., Disp: , Rfl:  .  omeprazole (PRILOSEC) 20 MG capsule, Take 20 mg by mouth daily., Disp: , Rfl:  .  oxyCODONE (OXY IR/ROXICODONE) 5 MG immediate release tablet, Take 5 mg by mouth every 4 (four) hours as needed for severe pain (Give 1 tab q4hrs prn moderate pain. May take 2 tabs q4h prn severe pain)., Disp: , Rfl:  .  ranitidine (ZANTAC) 150 MG tablet, Take 1 tablet (150 mg total) by mouth 2 (two) times daily., Disp: 60  tablet, Rfl: 3  Allergies  Allergen Reactions  . Thorazine [Chlorpromazine]     Review of Systems  Constitutional: Positive for weight loss. Negative for fever and chills.  HENT: Negative for congestion, hearing loss, sore throat and tinnitus.   Eyes: Negative for blurred vision, double vision and redness.  Respiratory: Positive for cough and shortness of breath. Negative for hemoptysis.   Cardiovascular: Positive for palpitations. Negative for chest pain, orthopnea, claudication and leg swelling.  Gastrointestinal: Negative for heartburn, nausea, vomiting, diarrhea, constipation and blood in stool.  Genitourinary: Negative for dysuria, urgency, frequency and hematuria.  Musculoskeletal: Negative for myalgias, back pain, joint pain, falls and neck pain.  Skin: Negative for itching.  Neurological: Negative for dizziness, tingling, tremors, focal weakness, seizures, loss of consciousness, weakness and headaches.  Endo/Heme/Allergies: Does not bruise/bleed easily.  Psychiatric/Behavioral: Negative for depression and substance abuse. The patient is not nervous/anxious and does not have insomnia.      Objective  Filed Vitals:   07/14/15 1026  BP: 112/60  Pulse: 109  Temp: 98 F (36.7 C)  Resp: 18  Weight: 110 lb 9 oz (50.151 kg)  SpO2: 97%     Physical Exam  Constitutional: He is oriented to person, place, and time.  Slightly cachectic male in no acute distress  HENT:  Head: Normocephalic.  Eyes: EOM are normal. Pupils are equal, round, and reactive to light.  Neck: Normal range of motion. Neck supple. No thyromegaly present.  Cardiovascular: Normal rate, regular rhythm and normal heart sounds.   No murmur heard. Pulmonary/Chest: Effort normal. No respiratory distress. He has no wheezes.  Decreased breath sounds throughout with hyperresonance to percussion  Abdominal: Soft. Bowel sounds are normal.  Musculoskeletal: Normal range of motion. He exhibits no edema.   Lymphadenopathy:    He has no cervical adenopathy.  Neurological: He is alert and oriented to person, place, and time. No cranial nerve deficit. Gait normal. Coordination normal.  Skin: Skin is warm and dry. No rash noted.  Psychiatric: Affect and judgment normal.      Assessment & Plan   1. Essential hypertension Well-controlled - CBC - Comprehensive Metabolic Panel (CMET)  2. Paroxysmal atrial fibrillation (HCC) Followed by cardiologist and currently well-controlled - Comprehensive Metabolic Panel (CMET) - TSH  3. Mild pulmonary hypertension ([place]) Persistent problem is he would not discontinue smoking  4. Mild left ventricular systolic dysfunction Followed by cardiologist  5. 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 Panlobular emphysema ([place]) Continue Advair again encouraged discontinuation of smoking    7. Gastroesophageal reflux disease without esophagitis Well-controlled  8. 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 Acute renal failure, unspecified acute renal failure type ([place]) Recheck labs  10. Personal history of prostate cancer Per urologist and things are stable there.  11. Hyperlipidemia Lipid panel  12. Malnutrition ([place]) Encourage dietary supplement meanwhile CBC, basic metabolic panel - CBC - Comprehensive Metabolic Panel (CMET)

## 2015-07-15 LAB — CBC
HEMATOCRIT: 42 % (ref 37.5–51.0)
HEMOGLOBIN: 13.7 g/dL (ref 12.6–17.7)
MCH: 28.4 pg (ref 26.6–33.0)
MCHC: 32.6 g/dL (ref 31.5–35.7)
MCV: 87 fL (ref 79–97)
Platelets: 228 10*3/uL (ref 150–379)
RBC: 4.83 x10E6/uL (ref 4.14–5.80)
RDW: 14.3 % (ref 12.3–15.4)
WBC: 8.7 10*3/uL (ref 3.4–10.8)

## 2015-07-15 LAB — COMPREHENSIVE METABOLIC PANEL
ALBUMIN: 3.8 g/dL (ref 3.6–4.8)
ALK PHOS: 110 IU/L (ref 39–117)
ALT: 22 IU/L (ref 0–44)
AST: 29 IU/L (ref 0–40)
Albumin/Globulin Ratio: 1 — ABNORMAL LOW (ref 1.1–2.5)
BUN / CREAT RATIO: 16 (ref 10–22)
BUN: 17 mg/dL (ref 8–27)
Bilirubin Total: <0.2 mg/dL (ref 0.0–1.2)
CALCIUM: 9.3 mg/dL (ref 8.6–10.2)
CO2: 25 mmol/L (ref 18–29)
CREATININE: 1.06 mg/dL (ref 0.76–1.27)
Chloride: 98 mmol/L (ref 96–106)
GFR, EST AFRICAN AMERICAN: 87 mL/min/{1.73_m2} (ref >59)
GFR, EST NON AFRICAN AMERICAN: 75 mL/min/{1.73_m2} (ref >59)
GLOBULIN, TOTAL: 3.7 g/dL (ref 1.5–4.5)
GLUCOSE: 70 mg/dL (ref 65–99)
Potassium: 5 mmol/L (ref 3.5–5.2)
SODIUM: 137 mmol/L (ref 134–144)
TOTAL PROTEIN: 7.5 g/dL (ref 6.0–8.5)

## 2015-07-15 LAB — TSH: TSH: 4.19 u[IU]/mL (ref 0.450–4.500)

## 2015-07-19 ENCOUNTER — Encounter: Payer: Self-pay | Admitting: Family Medicine

## 2015-07-19 DIAGNOSIS — E46 Unspecified protein-calorie malnutrition: Secondary | ICD-10-CM | POA: Insufficient documentation

## 2015-08-04 ENCOUNTER — Encounter: Payer: Self-pay | Admitting: Sports Medicine

## 2015-08-04 ENCOUNTER — Ambulatory Visit (INDEPENDENT_AMBULATORY_CARE_PROVIDER_SITE_OTHER): Payer: Medicaid Other | Admitting: Sports Medicine

## 2015-08-04 DIAGNOSIS — M79673 Pain in unspecified foot: Secondary | ICD-10-CM

## 2015-08-04 DIAGNOSIS — B351 Tinea unguium: Secondary | ICD-10-CM

## 2015-08-04 DIAGNOSIS — M79676 Pain in unspecified toe(s): Secondary | ICD-10-CM

## 2015-08-04 DIAGNOSIS — Q828 Other specified congenital malformations of skin: Secondary | ICD-10-CM

## 2015-08-04 NOTE — Progress Notes (Signed)
Patient ID: Christopher Martinez, male   DOB: 06-Feb-1956, 60 y.o.   MRN: [id number]  Subjective: Christopher Martinez is a 60 y.o. male patient seen today in office with complaint of callus and thickened and elongated toenails; unable to trim. Patient has no other pedal complaints at this time.   Patient Active Problem List   Diagnosis Date Noted  . Malnutrition ([place]) 07/19/2015  . Chronic obstructive pulmonary disease ([place]) 07/14/2015  . CAFL (chronic airflow limitation) ([place]) 03/07/2015  . Hyperlipidemia 03/07/2015  . Cancer of ureter ([place]) 11/08/2014  . Essential hypertension 08/09/2014  . Acute renal failure ([place]) 08/09/2014  . Moderate tricuspid regurgitation 08/09/2014  . Mild left ventricular systolic dysfunction [id number]  . Mild pulmonary hypertension ([place]) 08/09/2014  . Personal history of prostate cancer 08/09/2014  . A-fib ([place]) 08/05/2014  . Closed fracture of right distal femur ([place]) 08/05/2014  . GERD (gastroesophageal reflux disease) 08/05/2014  . Severe protein-calorie malnutrition ([place]) 08/05/2014  . Abnormal ECG 02/15/2014  . Cardiac conduction disorder 02/15/2014  . 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 Acute exacerbation of chronic obstructive airways disease ([place]) 02/15/2014  . Atrial fibrillation ([place]) 02/15/2014  . Hypertensive heart disease with heart failure ([place]) 02/15/2014  . Chronic obstructive pulmonary disease with acute exacerbation ([place]) 02/15/2014  . Benign essential HTN 12/01/2006   Current Outpatient Prescriptions on File Prior to Visit  Medication Sig Dispense Refill  . acetaminophen (TYLENOL) 500 MG tablet Take 1,000 mg by mouth every 6 (six) hours as needed.    [name] aspirin 81 MG tablet Take 1 tablet (81 mg total) by mouth daily. 30 tablet 3  . diltiazem (DILACOR XR) 240 MG 24 hr capsule Take 1 capsule (240 mg total) by mouth daily. 90 capsule 1  . enoxaparin (LOVENOX) 100 MG/ML injection Inject 100 mg into the skin.    .  fexofenadine (ALLEGRA) 180 MG tablet TAKE ONE TABLET BY MOUTH EVERY DAY 10 tablet 0  . Fluticasone-Salmeterol (ADVAIR) 100-50 MCG/DOSE AEPB Inhale 1 puff into the lungs 2 (two) times daily. 14 each 0  . Fluticasone-Salmeterol (ADVAIR) 100-50 MCG/DOSE AEPB Inhale 1 puff into the lungs 2 (two) times daily. 60 each 5  . LORazepam (ATIVAN) 0.5 MG tablet Take 1 tablet (0.5 mg total) by mouth 2 (two) times daily as needed for anxiety. 30 tablet 1  . magnesium hydroxide (MILK OF MAGNESIA) 800 MG/5ML suspension Take 30 mLs by mouth every 6 (six) hours as needed for constipation.    . metoprolol tartrate (LOPRESSOR) 25 MG tablet TAKE 3 TABLETS (75MG ) BY MOUTH 2 TIMES A DAY 180 tablet 1  . nicotine (NICODERM CQ - DOSED IN MG/24 HOURS) 21 mg/24hr patch Place 21 mg onto the skin daily.    [name] omeprazole (PRILOSEC) 20 MG capsule Take 20 mg by mouth daily.    [name] oxyCODONE (OXY IR/ROXICODONE) 5 MG immediate release tablet Take 5 mg by mouth every 4 (four) hours as needed for severe pain (Give 1 tab q4hrs prn moderate pain. May take 2 tabs q4h prn severe pain).    . ranitidine (ZANTAC) 150 MG tablet Take 1 tablet (150 mg total) by mouth 2 (two) times daily. 60 tablet 3   No current facility-administered medications on file prior to visit.   Allergies  Allergen Reactions  . Thorazine [Chlorpromazine]     Objective: Physical Exam  General: Well developed, nourished, no acute distress, awake, alert and oriented x 3  Vascular: Dorsalis pedis artery 2/4 bilateral, Posterior  tibial artery 1/4 bilateral, skin temperature warm to warm proximal to distal bilateral lower extremities, no varicosities, scant pedal hair present bilateral.  Neurological: Gross sensation present via light touch bilateral.   Dermatological: Skin is warm, dry, and supple bilateral, Nails 1-10 are tender, long, thick, and discolored with mild subungal debris, no webspace macerations present bilateral, no open lesions present bilateral,  Porokeratosis/ keratotic lesion with nucleated core present sub 5 bilateral with no signs of infection bilateral.  Musculoskeletal: Bunion and hammertoes noted bilateral. Muscular strength within normal limits without pain or limitation on range of motion. No pain with calf compression bilateral.  Assessment and Plan:  Problem List Items Addressed This Visit    None    Visit Diagnoses    Dermatophytosis of nail    -  Primary    Porokeratosis        Foot pain, unspecified laterality          -Examined patient.  -Discussed treatment options for painful mycotic nails and porokeratosis. -Mechanically debrided and reduced mycotic nails with sterile nail nipper and dremel nail file without incident. -Debrided porokeratosis x 2 using sterile chisel blade without incident.  -Recommend skin emollients for dry skin daily.  -Recommend good supportive shoes daily.  -Patient to return in 3 months for follow up evaluation or sooner if symptoms worsen.  Landis Martins, DPM

## 2015-08-16 ENCOUNTER — Telehealth: Payer: Self-pay | Admitting: Obstetrics and Gynecology

## 2015-08-16 NOTE — Telephone Encounter (Signed)
Vaughan Basta from [facility] called and stated pt thinks he should go to cardiologist, but according to notes, a cardiac clearance was sent over before prescription was called in.  Can you confirm, so we can let pt know?  [phone]

## 2015-08-17 NOTE — Telephone Encounter (Signed)
Clearance was previously obtained from Dr Clayborn Bigness for pt to be prescribed a PDE5 inhibitor for erectile dysfuction. Linda from [facility] asked if the medication would be covered by Medicaid. Per Herbert Moors there really isn't a medication like this that would be covered by Medicaid. Vaughan Basta states she will discuss with pt and will call back if he still wants a prescription.

## 2015-09-05 ENCOUNTER — Telehealth: Payer: Self-pay

## 2015-09-05 NOTE — Telephone Encounter (Signed)
Pt caregiver called stating pt wanted samples of ED medication. I did not see where you designated which medication he could try since having cardiac clearance. Please advise.

## 2015-09-06 NOTE — Telephone Encounter (Signed)
He can have samples of Viagra with instructions to take 1/2 tablet 30 mins prior to intercourse.  He needs to be scheduled for a follow up appt in a month with [facility].

## 2015-09-06 NOTE — Telephone Encounter (Signed)
Made caregiver aware when samples were picked up.

## 2015-10-11 ENCOUNTER — Ambulatory Visit (INDEPENDENT_AMBULATORY_CARE_PROVIDER_SITE_OTHER): Payer: Medicaid Other | Admitting: Urology

## 2015-10-11 ENCOUNTER — Encounter: Payer: Self-pay | Admitting: Urology

## 2015-10-11 VITALS — BP 113/75 | HR 73 | Ht 63.0 in | Wt 109.3 lb

## 2015-10-11 DIAGNOSIS — N529 Male erectile dysfunction, unspecified: Secondary | ICD-10-CM | POA: Diagnosis not present

## 2015-10-11 DIAGNOSIS — C61 Malignant neoplasm of prostate: Secondary | ICD-10-CM | POA: Diagnosis not present

## 2015-10-11 NOTE — Progress Notes (Signed)
10/11/2015 12:23 PM   Christopher Martinez 1956/05/01 [id number]  Referring provider: Ashok Norris, MD [address] [place], [place] [address]  Chief Complaint  Patient presents with  . Erectile Dysfunction    4 month follow up    HPI: 60 year old male with a history of prostate cancer, Gleason 3+4, I PSA 48 status post XRT 7 years ago followed by Dr. Donella Stade of the cancer center. His most recent PSA was 6.42 on 10/17/15 up from 4.7 11 months ago.  He has a follow-up with Dr. Baruch Gouty scheduled for mid May.  He returns today for follow-up of management of his erectile dysfunction.  He is given samples of Viagra last month and told to take 50 mg (half tablet) prior to intercourse.  He took the tab before bed and did not try to masturbate or try achieve an erection.  He states that the pills did not work but really did not take them properly.  He his not currently sexually active and has not "been with a woman" in over 2 years.  He cannot afford Viagra and this is not covered by Medicare.    He denies any baseline voiding symptoms.  He does have nocturia x2-3 but is not bothered by this.    Medical comorbidities including schizophrenia, CHF, pulmonary hypertension, and A. Fib.  He was cleared by Dr. Clayborn Bigness would for use of PDE5 inhibitors.   PMH: Past Medical History  Diagnosis Date  . Frequent headaches   . Chest pain   . Fall at home   . Femur fracture, right ([place])   . Hypertension   . Elevated troponin   . GERD (gastroesophageal reflux disease)   . Acute renal failure ([place])   . Mild left ventricular systolic dysfunction   . Pulmonary hypertension ([place])   . A-fib ([place])   . Prostate cancer ([place])   . Schizophrenia ([place])   . Hyperlipidemia   . Severe protein-calorie malnutrition ([place])   . ED (erectile dysfunction)     Surgical History: Past Surgical History  Procedure Laterality Date  . Orif right femur Right 07-29-2014    Home Medications:    Medication List        This list is accurate as of: 10/11/15 12:23 PM.  Always use your most recent med list.               acetaminophen 500 MG tablet  Commonly known as:  TYLENOL  Take 1,000 mg by mouth every 6 (six) hours as needed.     aspirin 81 MG tablet  Take 1 tablet (81 mg total) by mouth daily.     diltiazem 240 MG 24 hr capsule  Commonly known as:  DILACOR XR  Take 1 capsule (240 mg total) by mouth daily.     enoxaparin 100 MG/ML injection  Commonly known as:  LOVENOX  Inject 100 mg into the skin. Reported on 10/11/2015     fexofenadine 180 MG tablet  Commonly known as:  ALLEGRA  TAKE ONE TABLET BY MOUTH EVERY DAY     Fluticasone-Salmeterol 100-50 MCG/DOSE Aepb  Commonly known as:  ADVAIR  Inhale 1 puff into the lungs 2 (two) times daily.     Fluticasone-Salmeterol 100-50 MCG/DOSE Aepb  Commonly known as:  ADVAIR  Inhale 1 puff into the lungs 2 (two) times daily.     LORazepam 0.5 MG tablet  Commonly known as:  ATIVAN  Take 1 tablet (0.5 mg total) by mouth 2 (two) times  daily as needed for anxiety.     magnesium hydroxide 800 MG/5ML suspension  Commonly known as:  MILK OF MAGNESIA  Take 30 mLs by mouth every 6 (six) hours as needed for constipation.     metoprolol tartrate 25 MG tablet  Commonly known as:  LOPRESSOR  TAKE 3 TABLETS (75MG ) BY MOUTH 2 TIMES A DAY     nicotine 21 mg/24hr patch  Commonly known as:  NICODERM CQ - dosed in mg/24 hours  Place 21 mg onto the skin daily. Reported on 10/11/2015     omeprazole 20 MG capsule  Commonly known as:  PRILOSEC  Take 20 mg by mouth daily.     oxyCODONE 5 MG immediate release tablet  Commonly known as:  Oxy IR/ROXICODONE  Take 5 mg by mouth every 4 (four) hours as needed for severe pain (Give 1 tab q4hrs prn moderate pain. May take 2 tabs q4h prn severe pain). Reported on 10/11/2015     ranitidine 150 MG tablet  Commonly known as:  ZANTAC  Take 1 tablet (150 mg total) by mouth 2 (two) times daily.         Allergies:  Allergies  Allergen Reactions  . Thorazine [Chlorpromazine]     Family History: Family History  Problem Relation Age of Onset  . Kidney disease Neg Hx   . Prostate cancer Neg Hx     Social History:  reports that he has been smoking Cigarettes.  He has a 46 pack-year smoking history. He has never used smokeless tobacco. He reports that he does not drink alcohol or use illicit drugs.  ROS: UROLOGY Frequent Urination?: No Hard to postpone urination?: No Burning/pain with urination?: No Get up at night to urinate?: No Leakage of urine?: No Urine stream starts and stops?: No Trouble starting stream?: No Do you have to strain to urinate?: No Blood in urine?: No Urinary tract infection?: No Sexually transmitted disease?: No Injury to kidneys or bladder?: No Painful intercourse?: No Weak stream?: No Erection problems?: Yes Penile pain?: No  Gastrointestinal Nausea?: No Vomiting?: No Indigestion/heartburn?: No Diarrhea?: No Constipation?: No  Constitutional Fever: No Night sweats?: No Weight loss?: No Fatigue?: No  Skin Skin rash/lesions?: No Itching?: No  Eyes Blurred vision?: No Double vision?: No  Ears/Nose/Throat Sore throat?: No Sinus problems?: No  Hematologic/Lymphatic Swollen glands?: No Easy bruising?: No  Cardiovascular Leg swelling?: No Chest pain?: No  Respiratory Cough?: No Shortness of breath?: No  Endocrine Excessive thirst?: No  Musculoskeletal Back pain?: No Joint pain?: No  Neurological Headaches?: No Dizziness?: No  Psychologic Depression?: No Anxiety?: No  Physical Exam: BP 113/75 mmHg  Pulse 73  Ht 5\' 3"  (1.6 m)  Wt 109 lb 4.8 oz (49.578 kg)  BMI 19.37 kg/m2  Constitutional:  Alert and oriented, No acute distress.  Malodorous.   HEENT: [place] AT, moist mucus membranes.  Trachea midline, no masses. Cardiovascular: No clubbing, cyanosis, or edema. Respiratory: Normal respiratory effort, no increased  work of breathing. Neurologic: Grossly intact, no focal deficits, moving all 4 extremities. Psychiatric: Normal mood and affect.  Laboratory Data: Lab Results  Component Value Date   WBC 8.7 07/14/2015   HGB 9.5* 07/31/2014   HCT 42.0 07/14/2015   MCV 87 07/14/2015   PLT 228 07/14/2015    Lab Results  Component Value Date   CREATININE 1.06 07/14/2015    Lab Results  Component Value Date   PSA 6.42* 04/24/2015   PSA 4.7* 10/19/2014   PSA 5.5* 04/15/2014  Assessment & Plan:    1. Prostate cancer [facility]) S/p XRT Followed by Dr. Baruch Gouty   2. Erectile dysfunction, unspecified erectile dysfunction type Patient with baseline erectile dysfunction, tried PDE 5i without success but did not use correctly.  Patient education reviewed today and appropriate use of medication 1 hour prior to sexual activity ideally on an empty stomach and need for stimulation.  At this point, patient does not seem particularly interested in continuing with this therapy as he is not currently sexually active. Additionally, cost is currently prohibitive. I have advised him to return or call if he is interested in having this prescription filled in the future.   Return if symptoms worsen or fail to improve.  Hollice Espy, MD  [facility] [address], [place], [place] [address] [phone]

## 2015-10-17 ENCOUNTER — Ambulatory Visit (INDEPENDENT_AMBULATORY_CARE_PROVIDER_SITE_OTHER): Payer: Medicaid Other | Admitting: Internal Medicine

## 2015-10-17 ENCOUNTER — Encounter: Payer: Self-pay | Admitting: Internal Medicine

## 2015-10-17 VITALS — BP 116/64 | HR 74 | Ht 63.0 in | Wt 108.6 lb

## 2015-10-17 DIAGNOSIS — J438 Other emphysema: Secondary | ICD-10-CM | POA: Diagnosis not present

## 2015-10-17 NOTE — Patient Instructions (Signed)
--  Advised that he needs to quit smoking or his lung disease will continue to advance and his breathing will get continually worse.   --Continue advair, oxygen.

## 2015-10-17 NOTE — Progress Notes (Signed)
[facility] Consultation      Assessment and Plan:  -Dyspnea and respiratory failure. Possibly secondary to pulmonary and cardiac disease. I suspect he has a mixture of moderate COPD as well as some degree of interstitial lung disease. --The best thing that he can do for his health at this time is to quit smoking.      -COPD-emphysema. Will start advair 100 bid.  Continue nocturnal oxygen.  Discussed smoking cessation.   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 Given comorbidities, currentt smoking, poor functional status and cognitive dysfunction I do not think that he would be a good candidate for therapy for this, therefore will defer CT scanning at this time.   -Nicotine abuse. Discussed smoking cessation strategies today, though not sure that the patient is ready to quit at this time.  -Essential hypertension.  -Atrial fibrillation on anticoagulation.  -Pulmonary hypertension. Pulmonary systolic pressure of 60 mmHg noted on echocardiogram from 11/30/2013. Suspect that this is a either group 2 and/or group 3, secondary to cardiac and/or respiratory disease.    Date: 10/17/2015  MRN# [id number] Christopher Martinez 1955/09/22  Referring Physician: Ashok Norris  Christopher Martinez is a 60 y.o. old male seen in consultation for chief complaint of:    Chief Complaint  Patient presents with  . Follow-up    pt states breathing has improved. c/o prod cough green in color. denies wheezing/SOB/CP/tightness    HPI:  Patient is a 60 year old male smoker, with a history of A-fib, kidney disease, CHF, hypertension, prostate cancer and hyperlipidemia, who is a resident at a group home. We are following him for COPD and possible interstitial lung disease, he also has a history of smoking and pulmonary hypertension.  He is still smoking about a pack per day, not sure that he is ready to quit. He tells me that he has been using his inhalers as prescribed.    Review of  testing: -Chest x-ray 03/13/2015: Mild hyperinflation, bibasilar interstitial changes. -Pulmonary function test 04/18/2015: 10 shows moderate obstructive lung disease with an FEV1 of 64% of predicted, there is no change with proper dilator therapy. Expiratory reserve volume was elevated at 329%, suggesting air trapping, DLCO was reduced at 44%. -6 minute walk test, mild dyspnea, but pt limited by leg pain and poor gait.  -Overnight oximetry was performed on 03/15/2015: The patient spent 1 hour 32 minutes below 80, oxygen saturation 88% or less; it was recommended that he be put on 2 L of oxygen at night. -His baseline O2 sat was 95% and HR was 87 at rest at RA. He was ambulated for 700 feet with minimal dyspnea, sat was 94% and 108 at the end of the walk. -Echocardiogram results from 11/30/2013 reviewed. Ejection fraction was 55%, mild mitral regurgitation was noted. Moderate tricuspid regurgitation was noted, RV systolic pressure was 60 mmHg. There is no recent CXR , or PFT for review.    Medication:   Current Outpatient Rx  Name  Route  Sig  Dispense  Refill  . acetaminophen (TYLENOL) 500 MG tablet   Oral   Take 1,000 mg by mouth every 6 (six) hours as needed.         [name] aspirin 81 MG tablet   Oral   Take 1 tablet (81 mg total) by mouth daily.   30 tablet   3   . diltiazem (DILACOR XR) 240 MG 24 hr capsule   Oral   Take 1 capsule (240 mg total) by mouth  daily.   90 capsule   1   . enoxaparin (LOVENOX) 100 MG/ML injection   Subcutaneous   Inject 100 mg into the skin. Reported on 10/11/2015         . fexofenadine (ALLEGRA) 180 MG tablet      TAKE ONE TABLET BY MOUTH EVERY DAY   10 tablet   0   . EXPIRED: Fluticasone-Salmeterol (ADVAIR) 100-50 MCG/DOSE AEPB   Inhalation   Inhale 1 puff into the lungs 2 (two) times daily.   14 each   0   . Fluticasone-Salmeterol (ADVAIR) 100-50 MCG/DOSE AEPB   Inhalation   Inhale 1 puff into the lungs 2 (two) times daily.   60 each    5   . LORazepam (ATIVAN) 0.5 MG tablet   Oral   Take 1 tablet (0.5 mg total) by mouth 2 (two) times daily as needed for anxiety.   30 tablet   1   . magnesium hydroxide (MILK OF MAGNESIA) 800 MG/5ML suspension   Oral   Take 30 mLs by mouth every 6 (six) hours as needed for constipation.         . metoprolol tartrate (LOPRESSOR) 25 MG tablet      TAKE 3 TABLETS (75MG ) BY MOUTH 2 TIMES A DAY   180 tablet   1   . nicotine (NICODERM CQ - DOSED IN MG/24 HOURS) 21 mg/24hr patch   Transdermal   Place 21 mg onto the skin daily. Reported on 10/11/2015         . omeprazole (PRILOSEC) 20 MG capsule   Oral   Take 20 mg by mouth daily.         [name] oxyCODONE (OXY IR/ROXICODONE) 5 MG immediate release tablet   Oral   Take 5 mg by mouth every 4 (four) hours as needed for severe pain (Give 1 tab q4hrs prn moderate pain. May take 2 tabs q4h prn severe pain). Reported on 10/11/2015         . ranitidine (ZANTAC) 150 MG tablet   Oral   Take 1 tablet (150 mg total) by mouth 2 (two) times daily.   60 tablet   3       Allergies:  Thorazine  Review of Systems: Gen:  Denies  fever, sweats, chills HEENT: Denies blurred vision, Cvc:  No dizziness, chest pain. Resp:   Denies cough or sputum porduction, shortness of breath Gi: Denies swallowing difficulty, stomach pain. Gu:  Denies bladder incontinence, burning urine Ext:   No Joint pain, stiffness. Skin: No skin rash,  hives Endoc:  No polyuria, polydipsia. Psych: No depression, insomnia. Other:  All other systems were reviewed with the patient and were negative other that what is mentioned in the HPI.   Physical Examination:   VS: BP 116/64 mmHg  Pulse 74  Ht 5\' 3"  (1.6 m)  Wt 108 lb 9.6 oz (49.261 kg)  BMI 19.24 kg/m2  SpO2 100%  General Appearance: No distress  Neuro:without focal findings,  speech normal,  HEENT: PERRLA, EOM intact.   Pulmonary: Bibasilar crackles.  CardiovascularNormal S1,S2.  No m/r/g.   Abdomen:  Benign, Soft, non-tender. Renal:  No costovertebral tenderness  GU:  No performed at this time. Endoc: No evident thyromegaly, no signs of acromegaly. Skin:   warm, no rashes, no ecchymosis  Extremities: normal, no cyanosis, clubbing.  Other findings:    LABORATORY PANEL:   CBC No results for input(s): WBC, HGB, HCT, PLT in the last 168 hours. ------------------------------------------------------------------------------------------------------------------  Chemistries  No results for input(s): NA, K, CL, CO2, GLUCOSE, BUN, CREATININE, CALCIUM, MG, AST, ALT, ALKPHOS, BILITOT in the last 168 hours.  Invalid input(s): GFRCGP ------------------------------------------------------------------------------------------------------------------    Thank  you for the consultation and for allowing [facility] to assist in the care of your patient. Our recommendations are noted above.  Please contact us if we can be of further service.   Marda Stalker, MD.  Board Certified in Internal Medicine, Pulmonary Medicine, [place], and Sleep Medicine.  [facility] Office Number: [phone]  Patricia Pesa, M.D.  Vilinda Boehringer, M.D.  Cheral Marker, M.D

## 2015-11-03 ENCOUNTER — Encounter: Payer: Self-pay | Admitting: Sports Medicine

## 2015-11-03 ENCOUNTER — Ambulatory Visit (INDEPENDENT_AMBULATORY_CARE_PROVIDER_SITE_OTHER): Payer: Medicaid Other | Admitting: Sports Medicine

## 2015-11-03 DIAGNOSIS — M79676 Pain in unspecified toe(s): Secondary | ICD-10-CM

## 2015-11-03 DIAGNOSIS — B351 Tinea unguium: Secondary | ICD-10-CM

## 2015-11-03 DIAGNOSIS — Q828 Other specified congenital malformations of skin: Secondary | ICD-10-CM

## 2015-11-03 DIAGNOSIS — M79673 Pain in unspecified foot: Secondary | ICD-10-CM

## 2015-11-03 NOTE — Progress Notes (Signed)
Patient ID: Christopher Martinez, male   DOB: [birth year], 60 y.o.   MRN: [id number]  Subjective: Christopher Martinez is a 60 y.o. male patient seen today in office with complaint of callus and thickened and elongated toenails; unable to trim. Patient has no other pedal complaints at this time.   Patient Active Problem List   Diagnosis Date Noted  . Malnutrition ([place]) 07/19/2015  . Chronic obstructive pulmonary disease ([place]) 07/14/2015  . CAFL (chronic airflow limitation) ([place]) 03/07/2015  . Hyperlipidemia 03/07/2015  . HLD (hyperlipidemia) 03/07/2015  . Cancer of ureter ([place]) 11/08/2014  . Essential hypertension 08/09/2014  . Acute renal failure ([place]) 08/09/2014  . Moderate tricuspid regurgitation 08/09/2014  . Mild left ventricular systolic dysfunction 99991111  . Mild pulmonary hypertension ([place]) 08/09/2014  . Personal history of prostate cancer 08/09/2014  . A-fib ([place]) 08/05/2014  . Closed fracture of right distal femur ([place]) 08/05/2014  . GERD (gastroesophageal reflux disease) 08/05/2014  . Severe protein-calorie malnutrition ([place]) 08/05/2014  . Abnormal ECG 02/15/2014  . Cardiac conduction disorder 02/15/2014  . 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 Acute exacerbation of chronic obstructive airways disease ([place]) 02/15/2014  . Atrial fibrillation ([place]) 02/15/2014  . Hypertensive heart disease with heart failure ([place]) 02/15/2014  . Chronic obstructive pulmonary disease with acute exacerbation ([place]) 02/15/2014  . Cardiac arrhythmia 02/15/2014  . Benign essential HTN 12/01/2006   Current Outpatient Prescriptions on File Prior to Visit  Medication Sig Dispense Refill  . acetaminophen (TYLENOL) 500 MG tablet Take 1,000 mg by mouth every 6 (six) hours as needed.    [name] aspirin 81 MG tablet Take 1 tablet (81 mg total) by mouth daily. 30 tablet 3  . diltiazem (DILACOR XR) 240 MG 24 hr capsule Take 1 capsule (240 mg total) by mouth daily. 90 capsule 1  . enoxaparin  (LOVENOX) 100 MG/ML injection Inject 100 mg into the skin. Reported on 10/11/2015    . fexofenadine (ALLEGRA) 180 MG tablet TAKE ONE TABLET BY MOUTH EVERY DAY 10 tablet 0  . Fluticasone-Salmeterol (ADVAIR) 100-50 MCG/DOSE AEPB Inhale 1 puff into the lungs 2 (two) times daily. 14 each 0  . Fluticasone-Salmeterol (ADVAIR) 100-50 MCG/DOSE AEPB Inhale 1 puff into the lungs 2 (two) times daily. 60 each 5  . LORazepam (ATIVAN) 0.5 MG tablet Take 1 tablet (0.5 mg total) by mouth 2 (two) times daily as needed for anxiety. 30 tablet 1  . magnesium hydroxide (MILK OF MAGNESIA) 800 MG/5ML suspension Take 30 mLs by mouth every 6 (six) hours as needed for constipation.    . metoprolol tartrate (LOPRESSOR) 25 MG tablet TAKE 3 TABLETS (75MG ) BY MOUTH 2 TIMES A DAY 180 tablet 1  . omeprazole (PRILOSEC) 20 MG capsule Take 20 mg by mouth daily.    . ranitidine (ZANTAC) 150 MG tablet Take 1 tablet (150 mg total) by mouth 2 (two) times daily. 60 tablet 3   No current facility-administered medications on file prior to visit.   Allergies  Allergen Reactions  . Thorazine [Chlorpromazine]     Objective: Physical Exam  General: Well developed, nourished, no acute distress, awake, alert and oriented x 3  Vascular: Dorsalis pedis artery 2/4 bilateral, Posterior tibial artery 1/4 bilateral, skin temperature warm to warm proximal to distal bilateral lower extremities, no varicosities, scant pedal hair present bilateral.  Neurological: Gross sensation present via light touch bilateral.   Dermatological: Skin is warm, dry, and supple bilateral, Nails 1-10 are tender, long, thick, and discolored with  mild subungal debris, no webspace macerations present bilateral, no open lesions present bilateral, Porokeratosis/ keratotic lesion with nucleated core present sub 5 bilateral with no signs of infection bilateral.  Musculoskeletal: Bunion and hammertoes noted bilateral. Muscular strength within normal limits without pain or  limitation on range of motion. No pain with calf compression bilateral.  Assessment and Plan:  Problem List Items Addressed This Visit    None    Visit Diagnoses    Dermatophytosis of nail    -  Primary    Porokeratosis        Foot pain, unspecified laterality          -Examined patient.  -Discussed treatment options for painful mycotic nails and porokeratosis. -Mechanically debrided and reduced mycotic nails with sterile nail nipper and dremel nail file without incident. -Debrided porokeratosis x 2 using sterile chisel blade without incident.  -Recommend cont with skin emollients for dry skin daily.  -Recommend good supportive shoes daily.  -Patient to return in 3 months for follow up evaluation or sooner if symptoms worsen.  Landis Martins, DPM

## 2015-11-08 ENCOUNTER — Other Ambulatory Visit: Payer: Self-pay | Admitting: *Deleted

## 2015-11-08 DIAGNOSIS — C61 Malignant neoplasm of prostate: Secondary | ICD-10-CM

## 2015-11-13 ENCOUNTER — Encounter: Payer: Self-pay | Admitting: Radiation Oncology

## 2015-11-13 ENCOUNTER — Other Ambulatory Visit: Payer: Self-pay | Admitting: *Deleted

## 2015-11-13 ENCOUNTER — Ambulatory Visit
Admission: RE | Admit: 2015-11-13 | Discharge: 2015-11-13 | Disposition: A | Discharge status: Home / Self Care | Payer: Medicaid Other | Source: Ambulatory Visit | Attending: Radiation Oncology | Admitting: Radiation Oncology

## 2015-11-13 ENCOUNTER — Inpatient Hospital Stay: Payer: Medicaid Other | Attending: Radiation Oncology

## 2015-11-13 VITALS — BP 127/83 | HR 77 | Temp 94.9°F | Resp 20 | Wt 107.8 lb

## 2015-11-13 DIAGNOSIS — Z923 Personal history of irradiation: Secondary | ICD-10-CM | POA: Diagnosis not present

## 2015-11-13 DIAGNOSIS — C61 Malignant neoplasm of prostate: Secondary | ICD-10-CM

## 2015-11-13 DIAGNOSIS — F209 Schizophrenia, unspecified: Secondary | ICD-10-CM | POA: Diagnosis not present

## 2015-11-13 DIAGNOSIS — Z79818 Long term (current) use of other agents affecting estrogen receptors and estrogen levels: Secondary | ICD-10-CM | POA: Insufficient documentation

## 2015-11-13 LAB — PSA: PSA: 8.32 ng/mL — ABNORMAL HIGH (ref 0.00–4.00)

## 2015-11-13 NOTE — Progress Notes (Signed)
Radiation Oncology Follow up Note  Name: Christopher Martinez   Date:   11/13/2015 MRN:  [id number] DOB: 04/19/1956    This 60 y.o. male presents to the clinic today for prostate cancer now out 7-1/2 years.  REFERRING PROVIDER: Ashok Norris, MD  HPI: Patient is a schizophrenic 60 year old male now out 7/2 years having completed radiation therapy for a Gleason 7 (3+4) adenocarcinoma the prostate. Initial PSA was 48. His PSA has been slightly rising last was 6.4 back in October 2016 he is seen today in follow-up is doing well. Specifically denies bone pain any lower urinary tract symptoms or diarrhea.. He was being followed by urology for erectile dysfunction that is been discontinued.  COMPLICATIONS OF TREATMENT: none  FOLLOW UP COMPLIANCE: keeps appointments   PHYSICAL EXAM:  BP 127/83 mmHg  Pulse 77  Temp(Src) 94.9 F (34.9 C)  Resp 20  Wt 107 lb 12.9 oz (48.9 kg) Well-developed well-nourished patient in NAD. HEENT reveals PERLA, EOMI, discs not visualized.  Oral cavity is clear. No oral mucosal lesions are identified. Neck is clear without evidence of cervical or supraclavicular adenopathy. Lungs are clear to A&P. Cardiac examination is essentially unremarkable with regular rate and rhythm without murmur rub or thrill. Abdomen is benign with no organomegaly or masses noted. Motor sensory and DTR levels are equal and symmetric in the upper and lower extremities. Cranial nerves II through XII are grossly intact. Proprioception is intact. No peripheral adenopathy or edema is identified. No motor or sensory levels are noted. Crude visual fields are within normal range.  RADIOLOGY RESULTS: No current films for review  PLAN: Present time I rechecked his PSA will make a determination whether to pulse him with Lupron therapy or just to continue to observe at this point. Patient is well aware of my treatment plan. We'll see him back in 6 months for PSA check.  I would like to take this opportunity for  allowing me to participate in the care of your patient.Armstead Peaks., MD

## 2015-11-14 ENCOUNTER — Ambulatory Visit: Payer: Medicaid Other | Admitting: Family Medicine

## 2015-11-16 ENCOUNTER — Telehealth: Payer: Self-pay | Admitting: *Deleted

## 2015-11-16 ENCOUNTER — Other Ambulatory Visit: Payer: Self-pay | Admitting: *Deleted

## 2015-11-16 DIAGNOSIS — C61 Malignant neoplasm of prostate: Secondary | ICD-10-CM

## 2015-11-16 NOTE — Telephone Encounter (Signed)
Appointment for Lupron on 11/21/15 at 2:15 given to group home for Mr. Schlageter with read back of appt date and time.

## 2015-11-17 ENCOUNTER — Other Ambulatory Visit: Payer: Self-pay | Admitting: *Deleted

## 2015-11-21 ENCOUNTER — Inpatient Hospital Stay: Payer: Medicaid Other

## 2015-11-21 DIAGNOSIS — C61 Malignant neoplasm of prostate: Secondary | ICD-10-CM

## 2015-11-21 MED ORDER — LEUPROLIDE ACETATE (4 MONTH) 30 MG IM KIT
30.0000 mg | PACK | Freq: Once | INTRAMUSCULAR | Status: AC
  Administered 2015-11-21: 30 mg via INTRAMUSCULAR
  Filled 2015-11-21: qty 30

## 2015-11-28 ENCOUNTER — Ambulatory Visit: Payer: Medicaid Other | Admitting: Family Medicine

## 2015-11-29 ENCOUNTER — Ambulatory Visit (INDEPENDENT_AMBULATORY_CARE_PROVIDER_SITE_OTHER): Payer: Medicaid Other | Admitting: Family Medicine

## 2015-11-29 ENCOUNTER — Encounter: Payer: Self-pay | Admitting: Family Medicine

## 2015-11-29 VITALS — BP 120/68 | HR 90 | Temp 98.3°F | Resp 16 | Ht 63.0 in | Wt 108.1 lb

## 2015-11-29 DIAGNOSIS — I10 Essential (primary) hypertension: Secondary | ICD-10-CM | POA: Diagnosis not present

## 2015-11-29 DIAGNOSIS — I499 Cardiac arrhythmia, unspecified: Secondary | ICD-10-CM

## 2015-11-29 MED ORDER — METOPROLOL TARTRATE 25 MG PO TABS: ORAL_TABLET | ORAL | Status: DC

## 2015-11-29 MED ORDER — DILTIAZEM HCL ER 240 MG PO CP24
240.0000 mg | ORAL_CAPSULE | Freq: Every day | ORAL | Status: DC
Start: 2015-11-29 — End: 2016-04-01

## 2015-11-29 NOTE — Progress Notes (Signed)
Name: Christopher Martinez   MRN: [id number]    DOB: 1956-04-22   Date:11/29/2015       Progress Note  Subjective  Chief Complaint  Chief Complaint  Patient presents with  . Hypertension    4 month recheck    Hypertension This is a chronic problem. The problem is controlled. Pertinent negatives include no blurred vision, chest pain, headaches, palpitations or shortness of breath. Past treatments include calcium channel blockers and beta blockers.     Past Medical History  Diagnosis Date  . Frequent headaches   . Chest pain   . Fall at home   . Femur fracture, right ([place])   . Hypertension   . Elevated troponin   . GERD (gastroesophageal reflux disease)   . Acute renal failure ([place])   . Mild left ventricular systolic dysfunction   . Pulmonary hypertension ([place])   . A-fib ([place])   . Prostate cancer ([place])   . Schizophrenia ([place])   . Hyperlipidemia   . Severe protein-calorie malnutrition ([place])   . ED (erectile dysfunction)     Past Surgical History  Procedure Laterality Date  . Orif right femur Right 07-29-2014    Family History  Problem Relation Age of Onset  . Kidney disease Neg Hx   . Prostate cancer Neg Hx     Social History   Social History  . Marital Status: Single    Spouse Name: N/A  . Number of Children: N/A  . Years of Education: N/A   Occupational History  . Not on file.   Social History Main Topics  . Smoking status: Current Every Day Smoker -- 1.00 packs/day for 46 years    Types: Cigarettes  . Smokeless tobacco: Never Used     Comment: down to /5ppd per pt 03/13/15  . Alcohol Use: No  . Drug Use: No  . Sexual Activity: Not on file   Other Topics Concern  . Not on file   Social History Narrative     Current outpatient prescriptions:  .  acetaminophen (TYLENOL) 500 MG tablet, Take 1,000 mg by mouth every 6 (six) hours as needed., Disp: , Rfl:  .  aspirin 81 MG tablet, Take 1 tablet (81 mg total) by mouth daily., Disp: 30 tablet, Rfl: 3 .   diltiazem (DILACOR XR) 240 MG 24 hr capsule, Take 1 capsule (240 mg total) by mouth daily., Disp: 90 capsule, Rfl: 1 .  fexofenadine (ALLEGRA) 180 MG tablet, TAKE ONE TABLET BY MOUTH EVERY DAY, Disp: 10 tablet, Rfl: 0 .  Fluticasone-Salmeterol (ADVAIR) 100-50 MCG/DOSE AEPB, Inhale 1 puff into the lungs 2 (two) times daily., Disp: 14 each, Rfl: 0 .  Fluticasone-Salmeterol (ADVAIR) 100-50 MCG/DOSE AEPB, Inhale 1 puff into the lungs 2 (two) times daily., Disp: 60 each, Rfl: 5 .  LORazepam (ATIVAN) 0.5 MG tablet, Take 1 tablet (0.5 mg total) by mouth 2 (two) times daily as needed for anxiety., Disp: 30 tablet, Rfl: 1 .  magnesium hydroxide (MILK OF MAGNESIA) 800 MG/5ML suspension, Take 30 mLs by mouth every 6 (six) hours as needed for constipation., Disp: , Rfl:  .  metoprolol tartrate (LOPRESSOR) 25 MG tablet, TAKE 3 TABLETS (75MG ) BY MOUTH 2 TIMES A DAY, Disp: 180 tablet, Rfl: 1 .  omeprazole (PRILOSEC) 20 MG capsule, Take 20 mg by mouth daily., Disp: , Rfl:  .  ranitidine (ZANTAC) 150 MG tablet, Take 1 tablet (150 mg total) by mouth 2 (two) times daily., Disp: 60 tablet, Rfl: 3  Allergies  Allergen Reactions  . Thorazine [Chlorpromazine]      Review of Systems  Constitutional: Negative for fever and chills.  Eyes: Negative for blurred vision.  Respiratory: Negative for shortness of breath.   Cardiovascular: Negative for chest pain and palpitations.  Neurological: Negative for headaches.     Objective  Filed Vitals:   11/29/15 1454  BP: 120/68  Pulse: 90  Temp: 98.3 F (36.8 C)  TempSrc: Oral  Resp: 16  Height: 5\' 3"  (1.6 m)  Weight: 108 lb 1.6 oz (49.034 kg)  SpO2: 94%    Physical Exam  Constitutional: He is oriented to person, place, and time and well-developed, well-nourished, and in no distress.  HENT:  Head: Normocephalic and atraumatic.  Cardiovascular: An irregular rhythm present. Tachycardia present.   Pulmonary/Chest: Breath sounds normal.  Musculoskeletal:        Right ankle: He exhibits no swelling.       Left ankle: He exhibits no swelling.  Neurological: He is alert and oriented to person, place, and time.  Nursing note and vitals reviewed.     Assessment & Plan  1. Essential hypertension Blood pressure is at goal. - metoprolol tartrate (LOPRESSOR) 25 MG tablet; TAKE 3 TABLETS (75MG ) BY MOUTH 2 TIMES A DAY  Dispense: 150 tablet; Refill: 1 - diltiazem (DILACOR XR) 240 MG 24 hr capsule; Take 1 capsule (240 mg total) by mouth daily.  Dispense: 90 capsule; Refill: 1  2. Irregular cardiac rhythm Has atrial fibrillation, followed by cardiology, recently seen by Dr. Candis Musa. Office note reviewed.    Jahkeem Kurka [facility] 11/29/2015 3:50 PM

## 2016-01-23 ENCOUNTER — Other Ambulatory Visit: Payer: Self-pay | Admitting: Emergency Medicine

## 2016-01-23 MED ORDER — OMEPRAZOLE 20 MG PO CPDR: 20.0000 mg | DELAYED_RELEASE_CAPSULE | Freq: Every day | ORAL | 1 refills | Status: DC

## 2016-02-06 ENCOUNTER — Encounter: Payer: Self-pay | Admitting: Sports Medicine

## 2016-02-06 ENCOUNTER — Ambulatory Visit (INDEPENDENT_AMBULATORY_CARE_PROVIDER_SITE_OTHER): Payer: Medicaid Other | Admitting: Sports Medicine

## 2016-02-06 DIAGNOSIS — Q828 Other specified congenital malformations of skin: Secondary | ICD-10-CM

## 2016-02-06 DIAGNOSIS — B351 Tinea unguium: Secondary | ICD-10-CM

## 2016-02-06 DIAGNOSIS — M79676 Pain in unspecified toe(s): Secondary | ICD-10-CM

## 2016-02-06 DIAGNOSIS — M79673 Pain in unspecified foot: Secondary | ICD-10-CM

## 2016-02-06 NOTE — Progress Notes (Signed)
Patient ID: Christopher Martinez, male   DOB: [birth year], 60 y.o.   MRN: [id number]  Subjective: Christopher Martinez is a 60 y.o. male patient seen today in office with complaint of callus and thickened and elongated toenails; unable to trim. Patient has no other pedal complaints at this time.   Patient Active Problem List   Diagnosis Date Noted  . Irregular cardiac rhythm 11/29/2015  . Malnutrition ([place]) 07/19/2015  . Chronic obstructive pulmonary disease ([place]) 07/14/2015  . CAFL (chronic airflow limitation) ([place]) 03/07/2015  . Hyperlipidemia 03/07/2015  . HLD (hyperlipidemia) 03/07/2015  . Cancer of ureter ([place]) 11/08/2014  . Essential hypertension 08/09/2014  . Acute renal failure ([place]) 08/09/2014  . Moderate tricuspid regurgitation 08/09/2014  . Mild left ventricular systolic dysfunction 99991111  . Mild pulmonary hypertension ([place]) 08/09/2014  . Personal history of prostate cancer 08/09/2014  . A-fib ([place]) 08/05/2014  . Closed fracture of right distal femur ([place]) 08/05/2014  . GERD (gastroesophageal reflux disease) 08/05/2014  . Severe protein-calorie malnutrition ([place]) 08/05/2014  . Abnormal ECG 02/15/2014  . Cardiac conduction disorder 02/15/2014  . 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 Acute exacerbation of chronic obstructive airways disease ([place]) 02/15/2014  . Atrial fibrillation ([place]) 02/15/2014  . Hypertensive heart disease with heart failure ([place]) 02/15/2014  . Chronic obstructive pulmonary disease with acute exacerbation ([place]) 02/15/2014  . Cardiac arrhythmia 02/15/2014  . Benign essential HTN 12/01/2006   Current Outpatient Prescriptions on File Prior to Visit  Medication Sig Dispense Refill  . acetaminophen (TYLENOL) 500 MG tablet Take 1,000 mg by mouth every 6 (six) hours as needed.    [name] aspirin 81 MG tablet Take 1 tablet (81 mg total) by mouth daily. 30 tablet 3  . diltiazem (DILACOR XR) 240 MG 24 hr capsule Take 1 capsule (240 mg total) by  mouth daily. 90 capsule 1  . fexofenadine (ALLEGRA) 180 MG tablet TAKE ONE TABLET BY MOUTH EVERY DAY 10 tablet 0  . Fluticasone-Salmeterol (ADVAIR) 100-50 MCG/DOSE AEPB Inhale 1 puff into the lungs 2 (two) times daily. 14 each 0  . Fluticasone-Salmeterol (ADVAIR) 100-50 MCG/DOSE AEPB Inhale 1 puff into the lungs 2 (two) times daily. 60 each 5  . LORazepam (ATIVAN) 0.5 MG tablet Take 1 tablet (0.5 mg total) by mouth 2 (two) times daily as needed for anxiety. 30 tablet 1  . magnesium hydroxide (MILK OF MAGNESIA) 800 MG/5ML suspension Take 30 mLs by mouth every 6 (six) hours as needed for constipation.    . metoprolol tartrate (LOPRESSOR) 25 MG tablet TAKE 3 TABLETS (75MG ) BY MOUTH 2 TIMES A DAY 150 tablet 1  . omeprazole (PRILOSEC) 20 MG capsule Take 1 capsule (20 mg total) by mouth daily. 30 capsule 1  . ranitidine (ZANTAC) 150 MG tablet Take 1 tablet (150 mg total) by mouth 2 (two) times daily. 60 tablet 3   No current facility-administered medications on file prior to visit.    Allergies  Allergen Reactions  . Thorazine [Chlorpromazine]     Objective: Physical Exam  General: Well developed, nourished, no acute distress, awake, alert and oriented x 3  Vascular: Dorsalis pedis artery 2/4 bilateral, Posterior tibial artery 1/4 bilateral, skin temperature warm to warm proximal to distal bilateral lower extremities, no varicosities, scant pedal hair present bilateral.  Neurological: Gross sensation present via light touch bilateral.   Dermatological: Skin is warm, dry, and supple bilateral, Nails 1-10 are tender, long, thick, and discolored with mild subungal debris, no webspace macerations present  bilateral, no open lesions present bilateral, Porokeratosis/ keratotic lesion with nucleated core present sub 5 bilateral with no signs of infection bilateral.  Musculoskeletal: Bunion and hammertoes noted bilateral. Muscular strength within normal limits without pain or limitation on range of  motion. No pain with calf compression bilateral.  Assessment and Plan:  Problem List Items Addressed This Visit    None    Visit Diagnoses    Dermatophytosis of nail    -  Primary   Porokeratosis       Foot pain, unspecified laterality         -Examined patient.  -Discussed treatment options for painful mycotic nails and porokeratosis. -Mechanically debrided and reduced mycotic nails with sterile nail nipper and dremel nail file without incident. -Debrided porokeratosis x 2 using sterile chisel blade without incident.  -Recommend cont with skin emollients for dry skin daily. Note given for [facility] to allow patient to use skin lotion  -Recommend good supportive shoes daily.  -Patient to return in 3 months for follow up evaluation or sooner if symptoms worsen.  Christopher Martinez, DPM

## 2016-02-06 NOTE — Patient Instructions (Signed)
[facility]: Please allow patient to use cream or skin lotion for dry skin after bath or shower   Thanks Dr. Cannon Kettle

## 2016-02-15 ENCOUNTER — Other Ambulatory Visit: Payer: Self-pay

## 2016-02-15 DIAGNOSIS — I10 Essential (primary) hypertension: Secondary | ICD-10-CM

## 2016-02-15 MED ORDER — METOPROLOL TARTRATE 25 MG PO TABS: ORAL_TABLET | ORAL | 1 refills | Status: DC

## 2016-04-01 ENCOUNTER — Encounter: Payer: Self-pay | Admitting: Family Medicine

## 2016-04-01 ENCOUNTER — Ambulatory Visit (INDEPENDENT_AMBULATORY_CARE_PROVIDER_SITE_OTHER): Payer: Medicaid Other | Admitting: Family Medicine

## 2016-04-01 VITALS — BP 110/70 | HR 80 | Temp 97.4°F | Resp 18 | Ht 63.0 in | Wt 107.5 lb

## 2016-04-01 DIAGNOSIS — I480 Paroxysmal atrial fibrillation: Secondary | ICD-10-CM | POA: Diagnosis not present

## 2016-04-01 DIAGNOSIS — F209 Schizophrenia, unspecified: Secondary | ICD-10-CM | POA: Diagnosis not present

## 2016-04-01 DIAGNOSIS — L301 Dyshidrosis [pompholyx]: Secondary | ICD-10-CM

## 2016-04-01 DIAGNOSIS — Z23 Encounter for immunization: Secondary | ICD-10-CM

## 2016-04-01 DIAGNOSIS — E785 Hyperlipidemia, unspecified: Secondary | ICD-10-CM

## 2016-04-01 DIAGNOSIS — I10 Essential (primary) hypertension: Secondary | ICD-10-CM | POA: Diagnosis not present

## 2016-04-01 MED ORDER — DILTIAZEM HCL ER 240 MG PO CP24: 240.0000 mg | ORAL_CAPSULE | Freq: Every day | ORAL | 1 refills | Status: DC

## 2016-04-01 MED ORDER — METOPROLOL TARTRATE 25 MG PO TABS: ORAL_TABLET | ORAL | 2 refills | Status: DC

## 2016-04-01 MED ORDER — MOMETASONE FUROATE 0.1 % EX CREA: 1.0000 "application " | TOPICAL_CREAM | Freq: Every day | CUTANEOUS | 0 refills | Status: AC

## 2016-04-01 MED ORDER — LORAZEPAM 0.5 MG PO TABS: 0.5000 mg | ORAL_TABLET | Freq: Two times a day (BID) | ORAL | 2 refills | Status: DC | PRN

## 2016-04-01 NOTE — Progress Notes (Signed)
Name: Christopher Martinez   MRN: [id number]    DOB: 1956-05-19   Date:04/01/2016       Progress Note  Subjective  Chief Complaint  Chief Complaint  Patient presents with  . Follow-up    4 months  . Eczema    Hypertension  This is a chronic problem. The problem is unchanged. Pertinent negatives include no blurred vision, chest pain, headaches or palpitations. Past treatments include beta blockers and calcium channel blockers. There is no history of kidney disease, CAD/MI or CVA.  Hyperlipidemia  This is a chronic problem. The problem is controlled. Recent lipid tests were reviewed and are normal. Pertinent negatives include no chest pain. He is currently on no antihyperlipidemic treatment. Risk factors for coronary artery disease include dyslipidemia.  Rash  This is a new problem. The current episode started in the past 7 days. The affected locations include the left arm and right arm. The rash is characterized by blistering and itchiness. He was exposed to nothing. Past treatments include nothing.    Past Medical History:  Diagnosis Date  . A-fib ([place])   . Acute renal failure ([place])   . Chest pain   . ED (erectile dysfunction)   . Elevated troponin   . Fall at home   . Femur fracture, right ([place])   . Frequent headaches   . GERD (gastroesophageal reflux disease)   . Hyperlipidemia   . Hypertension   . Mild left ventricular systolic dysfunction   . Prostate cancer ([place])   . Pulmonary hypertension   . Schizophrenia ([place])   . Severe protein-calorie malnutrition ([place])     Past Surgical History:  Procedure Laterality Date  . orif right femur Right 07-29-2014    Family History  Problem Relation Age of Onset  . Kidney disease Neg Hx   . Prostate cancer Neg Hx     Social History   Social History  . Marital status: Single    Spouse name: N/A  . Number of children: N/A  . Years of education: N/A   Occupational History  . Not on file.   Social History Main Topics  . Smoking  status: Current Every Day Smoker    Packs/day: 1.00    Years: 46.00    Types: Cigarettes  . Smokeless tobacco: Never Used     Comment: down to /5ppd per pt 03/13/15  . Alcohol use No  . Drug use: No  . Sexual activity: Not on file   Other Topics Concern  . Not on file   Social History Narrative  . No narrative on file     Current Outpatient Prescriptions:  .  acetaminophen (TYLENOL) 500 MG tablet, Take 1,000 mg by mouth every 6 (six) hours as needed., Disp: , Rfl:  .  aspirin 81 MG tablet, Take 1 tablet (81 mg total) by mouth daily., Disp: 30 tablet, Rfl: 3 .  diltiazem (DILACOR XR) 240 MG 24 hr capsule, Take 1 capsule (240 mg total) by mouth daily., Disp: 90 capsule, Rfl: 1 .  fexofenadine (ALLEGRA) 180 MG tablet, TAKE ONE TABLET BY MOUTH EVERY DAY, Disp: 10 tablet, Rfl: 0 .  Fluticasone-Salmeterol (ADVAIR) 100-50 MCG/DOSE AEPB, Inhale 1 puff into the lungs 2 (two) times daily., Disp: 60 each, Rfl: 5 .  LORazepam (ATIVAN) 0.5 MG tablet, Take 1 tablet (0.5 mg total) by mouth 2 (two) times daily as needed for anxiety., Disp: 30 tablet, Rfl: 1 .  magnesium hydroxide (MILK OF MAGNESIA) 800 MG/5ML suspension, Take 30 mLs  by mouth every 6 (six) hours as needed for constipation., Disp: , Rfl:  .  metoprolol tartrate (LOPRESSOR) 25 MG tablet, TAKE 3 TABLETS (75MG ) BY MOUTH 2 TIMES A DAY, Disp: 150 tablet, Rfl: 1 .  omeprazole (PRILOSEC) 20 MG capsule, Take 1 capsule (20 mg total) by mouth daily., Disp: 30 capsule, Rfl: 1 .  ranitidine (ZANTAC) 150 MG tablet, Take 1 tablet (150 mg total) by mouth 2 (two) times daily., Disp: 60 tablet, Rfl: 3 .  Fluticasone-Salmeterol (ADVAIR) 100-50 MCG/DOSE AEPB, Inhale 1 puff into the lungs 2 (two) times daily., Disp: 14 each, Rfl: 0  Allergies  Allergen Reactions  . Thorazine [Chlorpromazine]      Review of Systems  Eyes: Negative for blurred vision.  Cardiovascular: Negative for chest pain and palpitations.  Skin: Positive for rash.  Neurological:  Negative for headaches.    Objective  Vitals:   04/01/16 1351  BP: 110/70  Pulse: 80  Resp: 18  Temp: 97.4 F (36.3 C)  TempSrc: Oral  SpO2: 96%  Weight: 107 lb 8 oz (48.8 kg)  Height: 5\' 3"  (1.6 m)    Physical Exam  Constitutional: He is well-developed, well-nourished, and in no distress.  HENT:  Head: Normocephalic and atraumatic.  Cardiovascular: Normal rate, S1 normal and S2 normal.  An irregular rhythm present.  No murmur heard. Pulmonary/Chest: Effort normal and breath sounds normal. No respiratory distress.  Skin:     Psychiatric: Memory, affect and judgment normal. He exhibits a depressed mood.  Nursing note and vitals reviewed.   Assessment & Plan  1. Need for influenza vaccination  - Flu Vaccine QUAD 36+ mos PF IM (Fluarix & Fluzone Quad PF)  2. Benign essential HTN  - diltiazem (DILACOR XR) 240 MG 24 hr capsule; Take 1 capsule (240 mg total) by mouth daily.  Dispense: 90 capsule; Refill: 1 - metoprolol tartrate (LOPRESSOR) 25 MG tablet; TAKE 3 TABLETS (75MG ) BY MOUTH 2 TIMES A DAY  Dispense: 150 tablet; Refill: 2  3. Dyslipidemia  - Lipid Profile - COMPLETE METABOLIC PANEL WITH GFR  4. Dermatitis, dyshidrotic  - mometasone (ELOCON) 0.1 % cream; Apply 1 application topically daily.  Dispense: 15 g; Refill: 0  5. Schizophrenia, unspecified type (HCC)  - LORazepam (ATIVAN) 0.5 MG tablet; Take 1 tablet (0.5 mg total) by mouth 2 (two) times daily as needed for anxiety.  Dispense: 60 tablet; Refill: 2   6. Paroxysmal atrial fibrillation (HCC) -Advised to follow up with cardiology, reportedly in the past has refused treatment   Makani Seckman [facility] 04/01/2016 2:09 PM

## 2016-04-02 LAB — COMPLETE METABOLIC PANEL WITH GFR
ALT: 35 U/L (ref 9–46)
AST: 39 U/L — AB (ref 10–35)
Albumin: 3.8 g/dL (ref 3.6–5.1)
Alkaline Phosphatase: 124 U/L — ABNORMAL HIGH (ref 40–115)
BILIRUBIN TOTAL: 0.4 mg/dL (ref 0.2–1.2)
BUN: 21 mg/dL (ref 7–25)
CHLORIDE: 106 mmol/L (ref 98–110)
CO2: 26 mmol/L (ref 20–31)
CREATININE: 0.96 mg/dL (ref 0.70–1.25)
Calcium: 9.7 mg/dL (ref 8.6–10.3)
GFR, Est African American: >89 mL/min (ref >=60)
GFR, Est Non African American: 84 mL/min (ref >=60)
GLUCOSE: 96 mg/dL (ref 65–99)
Potassium: 5.2 mmol/L (ref 3.5–5.3)
SODIUM: 141 mmol/L (ref 135–146)
TOTAL PROTEIN: 7.8 g/dL (ref 6.1–8.1)

## 2016-04-02 LAB — LIPID PANEL
Cholesterol: 132 mg/dL (ref 125–200)
HDL: 38 mg/dL — ABNORMAL LOW (ref >=40)
LDL CALC: 80 mg/dL (ref <130)
Total CHOL/HDL Ratio: 3.5 Ratio (ref <=5.0)
Triglycerides: 72 mg/dL (ref <150)
VLDL: 14 mg/dL (ref <30)

## 2016-05-08 ENCOUNTER — Ambulatory Visit: Payer: Medicaid Other | Admitting: Radiation Oncology

## 2016-05-08 ENCOUNTER — Inpatient Hospital Stay: Payer: Medicaid Other

## 2016-05-14 ENCOUNTER — Ambulatory Visit (INDEPENDENT_AMBULATORY_CARE_PROVIDER_SITE_OTHER): Payer: Medicaid Other | Admitting: Podiatry

## 2016-05-14 ENCOUNTER — Encounter: Payer: Self-pay | Admitting: Podiatry

## 2016-05-14 VITALS — BP 118/87 | HR 56 | Resp 16

## 2016-05-14 DIAGNOSIS — M79672 Pain in left foot: Secondary | ICD-10-CM

## 2016-05-14 DIAGNOSIS — M79676 Pain in unspecified toe(s): Secondary | ICD-10-CM

## 2016-05-14 DIAGNOSIS — M79671 Pain in right foot: Secondary | ICD-10-CM

## 2016-05-14 DIAGNOSIS — L84 Corns and callosities: Secondary | ICD-10-CM

## 2016-05-14 DIAGNOSIS — L851 Acquired keratosis [keratoderma] palmaris et plantaris: Secondary | ICD-10-CM

## 2016-05-14 DIAGNOSIS — B351 Tinea unguium: Secondary | ICD-10-CM | POA: Diagnosis not present

## 2016-05-14 DIAGNOSIS — M79609 Pain in unspecified limb: Principal | ICD-10-CM

## 2016-05-14 DIAGNOSIS — L608 Other nail disorders: Secondary | ICD-10-CM

## 2016-05-14 DIAGNOSIS — L603 Nail dystrophy: Secondary | ICD-10-CM

## 2016-05-26 NOTE — Progress Notes (Signed)
SUBJECTIVE Patient  presents to office today complaining of elongated, thickened nails. Pain while ambulating in shoes. Patient is unable to trim their own nails. Patient also complains of painful callus lesions to the weightbearing surfaces of the bilateral fifth MPJs. Patient states that her when walking.  OBJECTIVE General Patient is awake, alert, and oriented x 3 and in no acute distress. Derm painful hyperkeratotic skin lesions noted to the weightbearing surfaces of the 5th MPJs bilaterally. Skin is dry and supple bilateral. Negative open lesions or macerations. Remaining integument unremarkable. Nails are tender, long, thickened and dystrophic with subungual debris, consistent with onychomycosis, 1-5 bilateral. No signs of infection noted. Vasc  DP and PT pedal pulses palpable bilaterally. Temperature gradient within normal limits.  Neuro Epicritic and protective threshold sensation diminished bilaterally.  Musculoskeletal Exam No symptomatic pedal deformities noted bilateral. Muscular strength within normal limits.  ASSESSMENT 1. Onychodystrophic nails 1-5 bilateral with hyperkeratosis of nails.  2. Onychomycosis of nail due to dermatophyte bilateral 3. Pain in foot bilateral  PLAN OF CARE 1. Patient evaluated today.  2. Instructed to maintain good pedal hygiene and foot care.  3. Mechanical debridement of nails 1-5 bilaterally performed using a nail nipper. Filed with dremel without incident.  4. Excisional debridement of porokeratotic lesions was performed using a chisel blade without incident. 5. Return to clinic in 3 mos.    Edrick Kins, DPM Edrick Kins, DPM [facility]  Dr. Edrick Kins, [place]                                        [place], [place] [address]                Office [phone]  Fax [phone]

## 2016-06-05 ENCOUNTER — Other Ambulatory Visit: Payer: Self-pay | Admitting: *Deleted

## 2016-06-06 ENCOUNTER — Inpatient Hospital Stay: Payer: Medicaid Other | Attending: Radiation Oncology

## 2016-06-06 ENCOUNTER — Encounter: Payer: Self-pay | Admitting: Radiation Oncology

## 2016-06-06 ENCOUNTER — Other Ambulatory Visit: Payer: Self-pay | Admitting: *Deleted

## 2016-06-06 ENCOUNTER — Ambulatory Visit
Admission: RE | Admit: 2016-06-06 | Discharge: 2016-06-06 | Disposition: A | Discharge status: Home / Self Care | Payer: Medicaid Other | Source: Ambulatory Visit | Attending: Radiation Oncology | Admitting: Radiation Oncology

## 2016-06-06 VITALS — BP 124/80 | HR 79 | Temp 97.6°F | Resp 18 | Wt 110.2 lb

## 2016-06-06 DIAGNOSIS — Z923 Personal history of irradiation: Secondary | ICD-10-CM | POA: Insufficient documentation

## 2016-06-06 DIAGNOSIS — C61 Malignant neoplasm of prostate: Secondary | ICD-10-CM | POA: Insufficient documentation

## 2016-06-06 DIAGNOSIS — R9721 Rising PSA following treatment for malignant neoplasm of prostate: Secondary | ICD-10-CM | POA: Diagnosis not present

## 2016-06-06 LAB — PSA: PSA: 5.67 ng/mL — AB (ref 0.00–4.00)

## 2016-06-06 NOTE — Progress Notes (Signed)
Radiation Oncology Follow up Note  Name: Christopher Martinez   Date:   06/06/2016 MRN:  [id number] DOB: [birth year]    This 60 y.o. male presents to the clinic today for prostate cancer now 8 years out been treated with pulse Lupron therapy.  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 He is being followed for pulse Lupron therapy with rising PSA. His last Lupron injection was in May 2017 at which time his PSA was 8.32. I repeated his PSA today and will make further reformations based on that value. He seems to be doing well states he does have some soft bowel movements not really diarrhea. He's having no significant lower urinary tract symptoms..  COMPLICATIONS OF TREATMENT: none  FOLLOW UP COMPLIANCE: keeps appointments   PHYSICAL EXAM:  BP 124/80   Pulse 79   Temp 97.6 F (36.4 C)   Resp 18   Wt 110 lb 3.7 oz (50 kg)   BMI 19.53 kg/m  Well-developed well-nourished patient in NAD. HEENT reveals PERLA, EOMI, discs not visualized.  Oral cavity is clear. No oral mucosal lesions are identified. Neck is clear without evidence of cervical or supraclavicular adenopathy. Lungs are clear to A&P. Cardiac examination is essentially unremarkable with regular rate and rhythm without murmur rub or thrill. Abdomen is benign with no organomegaly or masses noted. Motor sensory and DTR levels are equal and symmetric in the upper and lower extremities. Cranial nerves II through XII are grossly intact. Proprioception is intact. No peripheral adenopathy or edema is identified. No motor or sensory levels are noted. Crude visual fields are within normal range.  RADIOLOGY RESULTS: No current films for review  PLAN: I have. His PSA level today and will report that separately. Depending on that value may offer another pulse of Lupron therapy. She have sig rise in his PSA I may refer him to medical oncology  for consideration of other treatments. Patient Has my treatment plan well.  I would like to take this opportunity to thank you for allowing me to participate in the care of your patient.Armstead Peaks., MD

## 2016-08-28 IMAGING — CR DG KNEE 1-2V*R*
1 series · 2 of 2 positions shown · non-contrast
Comparison: 07/26/2010

CLINICAL DATA: Postop

EXAM:
RIGHT KNEE - 1-2 VIEW

[Series 1: ap · 0.17mm/px · 2 of 2 slices shown]
[im 1/2]
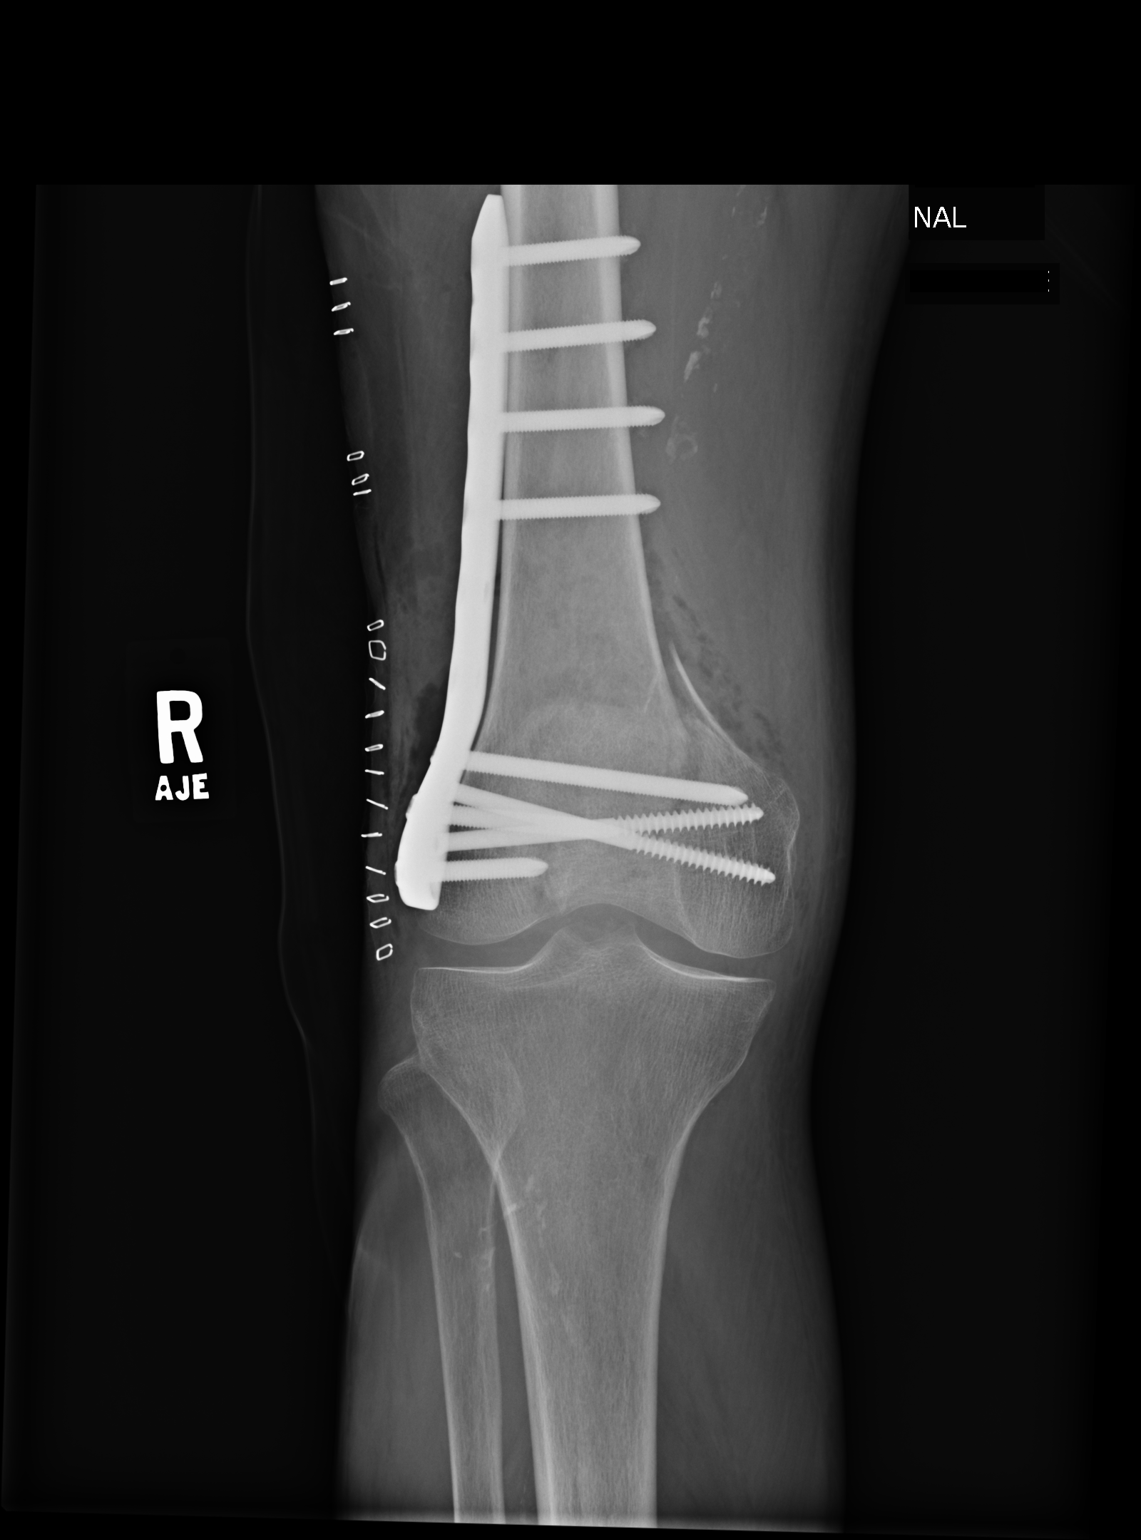
[im 2/2]
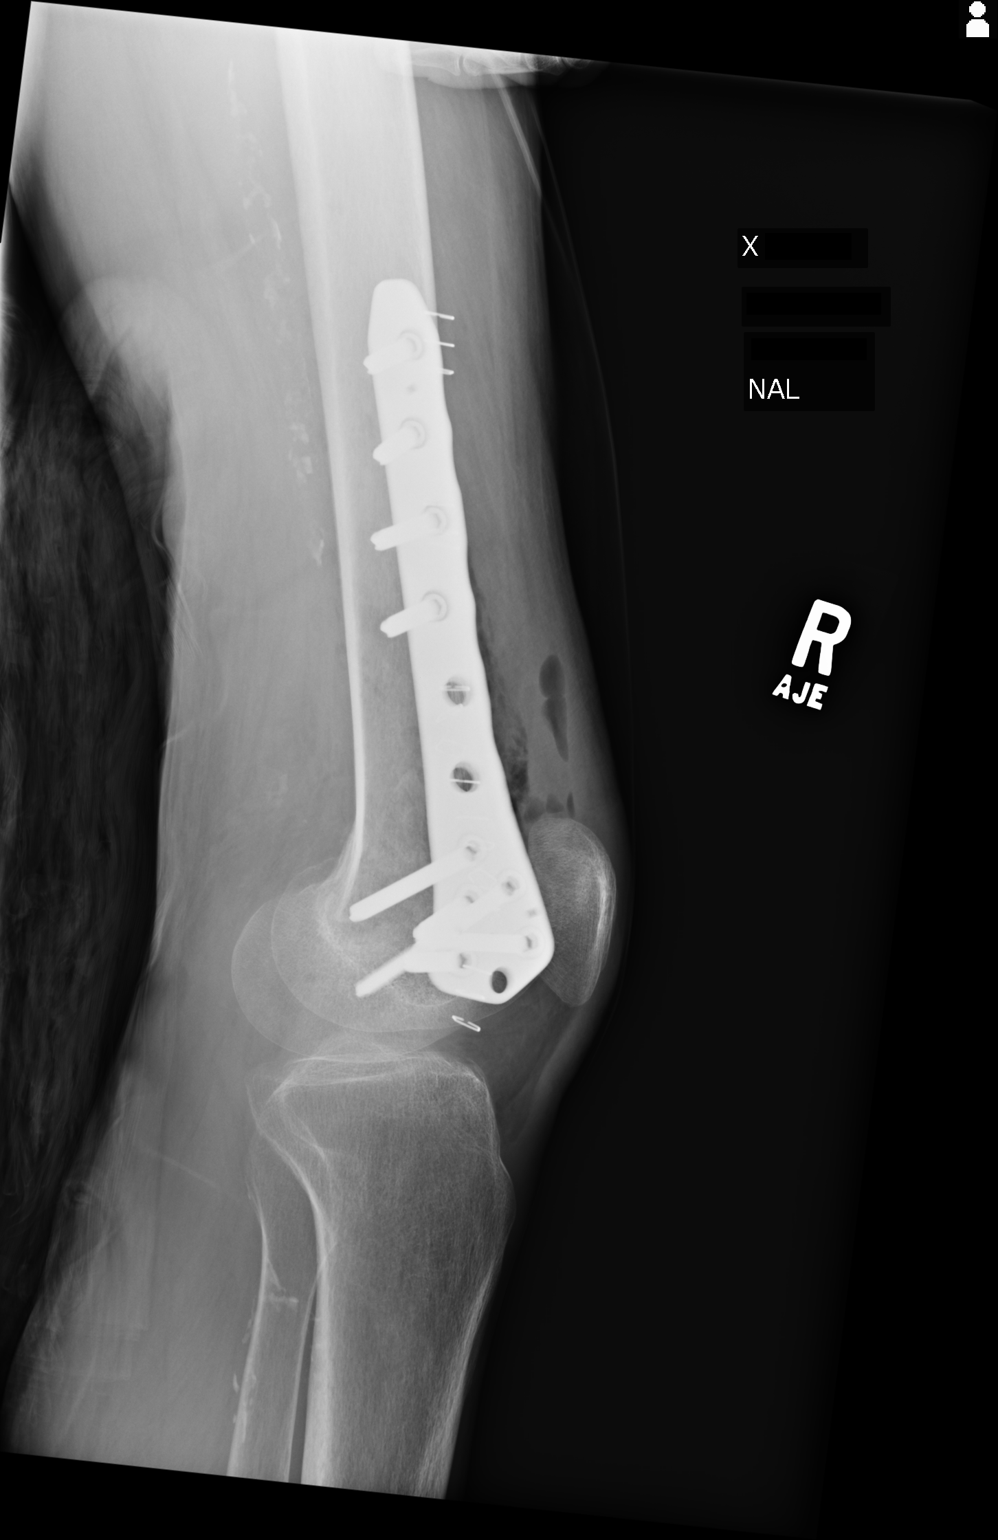

[2 of 2 positions shown; findings below may reference images not displayed]

FINDINGS: Two views of the right femur submitted. The patient is status post
intraoperative repair of distal right femoral fracture. There is a
lateral metallic fixation plate and metallic fixation screws are
noted in distal femur. There is improvement in alignment. Lateral
skin staples are noted. Postsurgical changes with small amount of
soft tissue air.
IMPRESSION: Status post intraoperative repair of distal right femoral fracture.
A lateral metallic fixation plate and screws are noted in distal
right femur. There is improvement in alignment.

## 2016-09-02 ENCOUNTER — Encounter: Payer: Self-pay | Admitting: Family Medicine

## 2016-09-02 ENCOUNTER — Ambulatory Visit (INDEPENDENT_AMBULATORY_CARE_PROVIDER_SITE_OTHER): Payer: Medicaid Other | Admitting: Family Medicine

## 2016-09-02 VITALS — BP 126/83 | HR 90 | Temp 97.9°F | Resp 17 | Ht 63.0 in | Wt 113.5 lb

## 2016-09-02 DIAGNOSIS — I10 Essential (primary) hypertension: Secondary | ICD-10-CM

## 2016-09-02 DIAGNOSIS — E785 Hyperlipidemia, unspecified: Secondary | ICD-10-CM | POA: Diagnosis not present

## 2016-09-02 DIAGNOSIS — J449 Chronic obstructive pulmonary disease, unspecified: Secondary | ICD-10-CM | POA: Diagnosis not present

## 2016-09-02 MED ORDER — FLUTICASONE-SALMETEROL 250-50 MCG/DOSE IN AEPB: 1.0000 | INHALATION_SPRAY | Freq: Two times a day (BID) | RESPIRATORY_TRACT | 0 refills | Status: DC

## 2016-09-02 MED ORDER — METOPROLOL TARTRATE 100 MG PO TABS: 100.0000 mg | ORAL_TABLET | Freq: Two times a day (BID) | ORAL | 0 refills | Status: DC

## 2016-09-02 NOTE — Progress Notes (Signed)
Name: Christopher Martinez   MRN: [id number]    DOB: 09-Aug-1955   Date:09/02/2016       Progress Note  Subjective  Chief Complaint  Chief Complaint  Patient presents with  . Follow-up    5 mo / cholesterol    Hypertension  This is a chronic problem. The problem is unchanged. The problem is controlled. Pertinent negatives include no blurred vision, chest pain, headaches, palpitations or shortness of breath. (Has hx of A-fib, followed by Cardiology, Metoprolol was increased 100 mg BID by Cardiology but patient did not get a new prescription, still taking 75 mg BID) Past treatments include calcium channel blockers and beta blockers. Hypertensive end-organ damage includes CAD/MI. There is no history of kidney disease or CVA.  Hyperlipidemia  This is a chronic problem. The problem is uncontrolled. Lipid results: HDL below normal. Pertinent negatives include no chest pain, leg pain, myalgias or shortness of breath. He is currently on no antihyperlipidemic treatment. Risk factors for coronary artery disease include dyslipidemia.   Pt. Has hx of COPD, long history of smoking, currently smokes approximately 1 ppd, denies any dyspnea, cough, increased sputum production or wheezing. He has been taking Advair 100-50 prescribed by Pulmonology.    Past Medical History:  Diagnosis Date  . A-fib ([place])   . Acute renal failure ([place])   . Chest pain   . ED (erectile dysfunction)   . Elevated troponin   . Fall at home   . Femur fracture, right ([place])   . Frequent headaches   . GERD (gastroesophageal reflux disease)   . Hyperlipidemia   . Hypertension   . Mild left ventricular systolic dysfunction   . Prostate cancer ([place])   . Pulmonary hypertension   . Schizophrenia ([place])   . Severe protein-calorie malnutrition ([place])     Past Surgical History:  Procedure Laterality Date  . orif right femur Right 07-29-2014    Family History  Problem Relation Age of Onset  . Kidney disease Neg Hx   . Prostate cancer Neg  Hx     Social History   Social History  . Marital status: Single    Spouse name: N/A  . Number of children: N/A  . Years of education: N/A   Occupational History  . Not on file.   Social History Main Topics  . Smoking status: Current Every Day Smoker    Packs/day: 1.00    Years: 46.00    Types: Cigarettes  . Smokeless tobacco: Never Used     Comment: down to /5ppd per pt 03/13/15  . Alcohol use No  . Drug use: No  . Sexual activity: Not on file   Other Topics Concern  . Not on file   Social History Narrative  . No narrative on file     Current Outpatient Prescriptions:  .  acetaminophen (TYLENOL) 500 MG tablet, Take 1,000 mg by mouth every 6 (six) hours as needed., Disp: , Rfl:  .  aspirin 81 MG tablet, Take 1 tablet (81 mg total) by mouth daily., Disp: 30 tablet, Rfl: 3 .  diltiazem (DILACOR XR) 240 MG 24 hr capsule, Take 1 capsule (240 mg total) by mouth daily., Disp: 90 capsule, Rfl: 1 .  fexofenadine (ALLEGRA) 180 MG tablet, TAKE ONE TABLET BY MOUTH EVERY DAY, Disp: 10 tablet, Rfl: 0 .  Fluticasone-Salmeterol (ADVAIR) 100-50 MCG/DOSE AEPB, Inhale 1 puff into the lungs 2 (two) times daily., Disp: 60 each, Rfl: 5 .  LORazepam (ATIVAN) 0.5 MG tablet, Take 1  tablet (0.5 mg total) by mouth 2 (two) times daily as needed for anxiety., Disp: 60 tablet, Rfl: 2 .  magnesium hydroxide (MILK OF MAGNESIA) 800 MG/5ML suspension, Take 30 mLs by mouth every 6 (six) hours as needed for constipation., Disp: , Rfl:  .  metoprolol tartrate (LOPRESSOR) 25 MG tablet, TAKE 3 TABLETS (75MG ) BY MOUTH 2 TIMES A DAY, Disp: 150 tablet, Rfl: 2 .  mometasone (ELOCON) 0.1 % cream, Apply 1 application topically daily., Disp: 15 g, Rfl: 0 .  omeprazole (PRILOSEC) 20 MG capsule, Take 1 capsule (20 mg total) by mouth daily., Disp: 30 capsule, Rfl: 1 .  ranitidine (ZANTAC) 150 MG tablet, Take 1 tablet (150 mg total) by mouth 2 (two) times daily., Disp: 60 tablet, Rfl: 3 .  Fluticasone-Salmeterol (ADVAIR)  100-50 MCG/DOSE AEPB, Inhale 1 puff into the lungs 2 (two) times daily., Disp: 14 each, Rfl: 0  Allergies  Allergen Reactions  . Thorazine [Chlorpromazine]      Review of Systems  Eyes: Negative for blurred vision.  Respiratory: Negative for shortness of breath.   Cardiovascular: Negative for chest pain and palpitations.  Musculoskeletal: Negative for myalgias.  Neurological: Negative for headaches.     Objective  Vitals:   09/02/16 1330  BP: 126/83  Pulse: 90  Resp: 17  Temp: 97.9 F (36.6 C)  TempSrc: Oral  SpO2: 93%  Weight: 113 lb 8 oz (51.5 kg)  Height: 5\' 3"  (1.6 m)    Physical Exam  Constitutional: He is oriented to person, place, and time and well-developed, well-nourished, and in no distress.  Cardiovascular: Normal rate, regular rhythm and normal heart sounds.   No murmur heard. Pulmonary/Chest: Effort normal and breath sounds normal. He has no wheezes.  Abdominal: Soft. Bowel sounds are normal. There is no tenderness.  Musculoskeletal: He exhibits no edema.  Neurological: He is alert and oriented to person, place, and time.  Psychiatric: Mood, memory, affect and judgment normal.  Nursing note and vitals reviewed.     Assessment & Plan  1. Benign essential HTN For both rate control in paroxysmal A. fib and hypertension, increased metoprolol to 100 mg twice a day per cardiology recommendations. - metoprolol tartrate (LOPRESSOR) 100 MG tablet; Take 1 tablet (100 mg total) by mouth 2 (two) times daily.  Dispense: 180 tablet; Refill: 0  2. Chronic obstructive pulmonary disease, unspecified COPD type ([place]) Changed from 100-50 to 250-50 for COPD, patient continues to smoke. - Fluticasone-Salmeterol (ADVAIR DISKUS) 250-50 MCG/DOSE AEPB; Inhale 1 puff into the lungs 2 (two) times daily.  Dispense: 60 each; Refill: 0  3. Dyslipidemia  - Lipid panel   Christopher Wandell [facility] 09/02/2016 1:38 PM

## 2016-10-07 ENCOUNTER — Other Ambulatory Visit: Payer: Self-pay | Admitting: Family Medicine

## 2016-10-07 DIAGNOSIS — I10 Essential (primary) hypertension: Secondary | ICD-10-CM

## 2016-10-24 ENCOUNTER — Other Ambulatory Visit: Payer: Self-pay | Admitting: Family Medicine

## 2016-10-24 DIAGNOSIS — J449 Chronic obstructive pulmonary disease, unspecified: Secondary | ICD-10-CM

## 2016-11-06 ENCOUNTER — Telehealth: Payer: Self-pay | Admitting: Family Medicine

## 2016-11-06 DIAGNOSIS — F209 Schizophrenia, unspecified: Secondary | ICD-10-CM

## 2016-11-06 MED ORDER — LORAZEPAM 0.5 MG PO TABS: 0.5000 mg | ORAL_TABLET | Freq: Two times a day (BID) | ORAL | 2 refills | Status: DC | PRN

## 2016-11-06 NOTE — Telephone Encounter (Signed)
Christopher Martinez from [facility] is requesting a refill on lorazepam 0.5mg . Please fax to [phone]. States that she faxed the request in march and faxed it again today. Pt is completely out.

## 2016-11-06 NOTE — Telephone Encounter (Signed)
Prescription for lorazepam is printed and is ready for pickup or fax

## 2016-11-07 NOTE — Telephone Encounter (Addendum)
That medicine can be called in by the CMA as written by  Dr. Manuella Ghazi; it does not require a wet signature and does not have to be picked up (That Rx will be under Dr. Trena Platt name, not mine)

## 2016-11-07 NOTE — Telephone Encounter (Signed)
Rhonda informed patient however the prescription was not signed therefore pt was not able to pick it up.

## 2016-11-08 NOTE — Telephone Encounter (Signed)
Medication has been called in to pharmacy.  

## 2016-11-14 ENCOUNTER — Ambulatory Visit: Payer: Medicaid Other | Admitting: Podiatry

## 2016-11-15 ENCOUNTER — Ambulatory Visit (INDEPENDENT_AMBULATORY_CARE_PROVIDER_SITE_OTHER): Payer: Medicaid Other | Admitting: Podiatry

## 2016-11-15 DIAGNOSIS — B351 Tinea unguium: Secondary | ICD-10-CM | POA: Diagnosis not present

## 2016-11-15 DIAGNOSIS — M79676 Pain in unspecified toe(s): Secondary | ICD-10-CM

## 2016-11-15 DIAGNOSIS — Q828 Other specified congenital malformations of skin: Secondary | ICD-10-CM | POA: Diagnosis not present

## 2016-11-16 NOTE — Progress Notes (Signed)
SUBJECTIVE Patient  presents to office today complaining of elongated, thickened nails. Pain while ambulating in shoes. Patient is unable to trim his own nails. Patient also complains of painful callus lesions to the weightbearing surfaces of the bilateral feet.  OBJECTIVE General Patient is awake, alert, and oriented x 3 and in no acute distress. Derm Hyperkeratotic skin lesions noted to the weightbearing surfaces of the 5th MPJs bilaterally. Skin is dry and supple bilateral. Negative open lesions or macerations. Remaining integument unremarkable. Nails are tender, long, thickened and dystrophic with subungual debris, consistent with onychomycosis, 1-5 bilateral. No signs of infection noted. Vasc  DP and PT pedal pulses palpable bilaterally. Temperature gradient within normal limits.  Neuro Epicritic and protective threshold sensation diminished bilaterally.  Musculoskeletal Exam No symptomatic pedal deformities noted bilateral. Muscular strength within normal limits.  ASSESSMENT 1. Onychodystrophic nails 1-5 bilateral with hyperkeratosis of nails.  2. Onychomycosis of nail due to dermatophyte bilateral 3. Pain in foot bilateral  PLAN OF CARE 1. Patient evaluated today.  2. Instructed to maintain good pedal hygiene and foot care.  3. Mechanical debridement of nails 1-5 bilaterally performed using a nail nipper. Filed with dremel without incident.  4. Excisional debridement of porokeratotic lesions was performed using a chisel blade without incident. 5. Return to clinic in 3 mos.    Edrick Kins, DPM Edrick Kins, DPM [facility]  Dr. Edrick Kins, [place]                                        [place], [place] [address]                Office [phone]  Fax [phone]

## 2016-12-04 ENCOUNTER — Other Ambulatory Visit: Payer: Self-pay | Admitting: Family Medicine

## 2016-12-04 DIAGNOSIS — I10 Essential (primary) hypertension: Secondary | ICD-10-CM

## 2016-12-05 ENCOUNTER — Ambulatory Visit
Admission: RE | Admit: 2016-12-05 | Discharge: 2016-12-05 | Disposition: A | Discharge status: Home / Self Care | Payer: Medicaid Other | Source: Ambulatory Visit | Attending: Radiation Oncology | Admitting: Radiation Oncology

## 2016-12-05 ENCOUNTER — Other Ambulatory Visit: Payer: Self-pay | Admitting: *Deleted

## 2016-12-05 ENCOUNTER — Encounter: Payer: Self-pay | Admitting: Radiation Oncology

## 2016-12-05 ENCOUNTER — Inpatient Hospital Stay: Payer: Medicaid Other | Attending: Radiation Oncology

## 2016-12-05 VITALS — BP 128/84 | HR 97 | Temp 98.0°F | Wt 107.7 lb

## 2016-12-05 DIAGNOSIS — Z923 Personal history of irradiation: Secondary | ICD-10-CM | POA: Diagnosis not present

## 2016-12-05 DIAGNOSIS — C61 Malignant neoplasm of prostate: Secondary | ICD-10-CM

## 2016-12-05 NOTE — Progress Notes (Signed)
Radiation Oncology Follow up Note  Name: Christopher Martinez   Date:   12/05/2016 MRN:  [id number] DOB: 12-28-1955    This 61 y.o. male presents to the clinic today for follow-up for prostate cancer now 8-1/2 years out on pulse Lupron therapy for biochemical failure.  REFERRING PROVIDER: Ashok Norris, MD  HPI: Patient is a 61 year old male now 8/2 years out having completed radiation therapy to his prostate for adenocarcinoma. We have been tracking his PSA which last time back in December 2017 was 5.67.[name] It climbed to a peak of 8.31 year prior. He specifically denies any increased lower urinary tract symptoms diarrhea or bone pain. We tested his PSA today.  COMPLICATIONS OF TREATMENT: none  FOLLOW UP COMPLIANCE: keeps appointments   PHYSICAL EXAM:  BP 128/84   Pulse 97   Temp 98 F (36.7 C)   Wt 107 lb 11.1 oz (48.9 kg)   BMI 19.08 kg/m  Well-developed well-nourished patient in NAD. HEENT reveals PERLA, EOMI, discs not visualized.  Oral cavity is clear. No oral mucosal lesions are identified. Neck is clear without evidence of cervical or supraclavicular adenopathy. Lungs are clear to A&P. Cardiac examination is essentially unremarkable with regular rate and rhythm without murmur rub or thrill. Abdomen is benign with no organomegaly or masses noted. Motor sensory and DTR levels are equal and symmetric in the upper and lower extremities. Cranial nerves II through XII are grossly intact. Proprioception is intact. No peripheral adenopathy or edema is identified. No motor or sensory levels are noted. Crude visual fields are within normal range.  RADIOLOGY RESULTS: No current films for review  PLAN: At this time I pretest his PSA will make a determination whether he needs further Lupron therapy. Otherwise I've asked to see him back in 6 months for follow-up. Patient knows to call sooner with any concerns.  I would like to take this opportunity to thank you for allowing me to participate in the care  of your patient.Armstead Peaks., MD

## 2016-12-12 ENCOUNTER — Telehealth: Payer: Self-pay | Admitting: Internal Medicine

## 2016-12-12 NOTE — Progress Notes (Signed)
error 

## 2016-12-12 NOTE — Telephone Encounter (Signed)
Received documentation today from [place] or 02 may be d/c. Patient needs office visit with notes faxed discussing his 02 usage and benefits. Left office number for patient to call and schedule appt ASAP.

## 2016-12-16 ENCOUNTER — Ambulatory Visit (INDEPENDENT_AMBULATORY_CARE_PROVIDER_SITE_OTHER): Payer: Medicaid Other | Admitting: Family Medicine

## 2016-12-16 ENCOUNTER — Encounter: Payer: Self-pay | Admitting: Family Medicine

## 2016-12-16 VITALS — BP 116/74 | HR 106 | Temp 98.0°F | Resp 16 | Wt 104.1 lb

## 2016-12-16 DIAGNOSIS — IMO0001 Reserved for inherently not codable concepts without codable children: Secondary | ICD-10-CM

## 2016-12-16 DIAGNOSIS — Z598 Other problems related to housing and economic circumstances: Secondary | ICD-10-CM

## 2016-12-16 NOTE — Progress Notes (Signed)
Name: Christopher Martinez   MRN: [id number]    DOB: December 21, 1955   Date:12/16/2016       Progress Note  Subjective  Chief Complaint  Chief Complaint  Patient presents with  . Forms    to be filled out    HPI  Pt. Presents for completion of [place] form. He is a resident of Adult Care home, has a diagnosis of Schizophrenia.   Past Medical History:  Diagnosis Date  . A-fib ([place])   . Acute renal failure ([place])   . Chest pain   . ED (erectile dysfunction)   . Elevated troponin   . Fall at home   . Femur fracture, right ([place])   . Frequent headaches   . GERD (gastroesophageal reflux disease)   . Hyperlipidemia   . Hypertension   . Mild left ventricular systolic dysfunction   . Prostate cancer ([place])   . Pulmonary hypertension ([place])   . Schizophrenia ([place])   . Severe protein-calorie malnutrition ([place])     Past Surgical History:  Procedure Laterality Date  . orif right femur Right 07-29-2014    Family History  Problem Relation Age of Onset  . Kidney disease Neg Hx   . Prostate cancer Neg Hx     Social History   Social History  . Marital status: Single    Spouse name: N/A  . Number of children: N/A  . Years of education: N/A   Occupational History  . Not on file.   Social History Main Topics  . Smoking status: Current Every Day Smoker    Packs/day: 1.00    Years: 46.00    Types: Cigarettes  . Smokeless tobacco: Never Used     Comment: down to /5ppd per pt 03/13/15  . Alcohol use No  . Drug use: No  . Sexual activity: Not on file   Other Topics Concern  . Not on file   Social History Narrative  . No narrative on file     Current Outpatient Prescriptions:  .  acetaminophen (TYLENOL) 500 MG tablet, Take 1,000 mg by mouth every 6 (six) hours as needed., Disp: , Rfl:  .  ADVAIR DISKUS 250-50 MCG/DOSE AEPB, INHALE 1 PUFF BY MOUTH TWO TIMES A DAY *RINSE MOUTH AFTER USE*, Disp: 60 each, Rfl: 11 .  aspirin 81 MG tablet, Take 1 tablet (81 mg total) by mouth  daily., Disp: 30 tablet, Rfl: 3 .  DILT-XR 240 MG 24 hr capsule, TAKE ONE CAPSULE BY MOUTH EVERY DAY *DO NOT CRUSH*, Disp: 90 capsule, Rfl: 0 .  fexofenadine (ALLEGRA) 180 MG tablet, TAKE ONE TABLET BY MOUTH EVERY DAY, Disp: 10 tablet, Rfl: 0 .  LORazepam (ATIVAN) 0.5 MG tablet, Take 1 tablet (0.5 mg total) by mouth 2 (two) times daily as needed for anxiety., Disp: 60 tablet, Rfl: 2 .  magnesium hydroxide (MILK OF MAGNESIA) 800 MG/5ML suspension, Take 30 mLs by mouth every 6 (six) hours as needed for constipation., Disp: , Rfl:  .  metoprolol tartrate (LOPRESSOR) 100 MG tablet, Take 1 tablet (100 mg total) by mouth 2 (two) times daily., Disp: 180 tablet, Rfl: 0 .  mometasone (ELOCON) 0.1 % cream, Apply 1 application topically daily., Disp: 15 g, Rfl: 0 .  omeprazole (PRILOSEC) 20 MG capsule, Take 1 capsule (20 mg total) by mouth daily., Disp: 30 capsule, Rfl: 1 .  ranitidine (ZANTAC) 150 MG tablet, Take 1 tablet (150 mg total) by mouth 2 (two) times daily., Disp: 60 tablet, Rfl: 3  Allergies  Allergen Reactions  . Thorazine [Chlorpromazine]      ROS  Please see history of present illness for complete discussion of ROS  Objective  Vitals:   12/16/16 1356  BP: 116/74  Pulse: (!) 106  Resp: 16  Temp: 98 F (36.7 C)  TempSrc: Oral  SpO2: 93%  Weight: 104 lb 1.6 oz (47.2 kg)    Physical Exam  Constitutional: He is well-developed, well-nourished, and in no distress. Vital signs are normal.  Cardiovascular: Regular rhythm and normal heart sounds.  Tachycardia present.   Pulmonary/Chest: Breath sounds normal. He has no wheezes.  Psychiatric: Mood normal. He has a flat affect.  Nursing note and vitals reviewed.      Assessment & Plan  1. Lives in independent group home Completed paperwork for adult care home, reviewed history with patient's caregiver. Copies For records  Note: Total face-to-face time: 25 minutes, greater than 50% was spent in counseling coronation of care,  this included reviewing pertinent history, asking questions about patient's living status, reviewing medications and completion of form. Jeris Roser [facility] 12/16/2016 2:36 PM

## 2016-12-20 ENCOUNTER — Other Ambulatory Visit: Payer: Self-pay | Admitting: Family Medicine

## 2016-12-20 NOTE — Telephone Encounter (Signed)
Please find out why patient is on the chewable aspirin His other medicines show capsules so I'm guessing he doesn't have a swallowing problem

## 2016-12-23 ENCOUNTER — Other Ambulatory Visit: Payer: Self-pay | Admitting: Family Medicine

## 2016-12-23 DIAGNOSIS — I10 Essential (primary) hypertension: Secondary | ICD-10-CM

## 2016-12-23 NOTE — Telephone Encounter (Signed)
It is suppose to be regular

## 2016-12-31 MED ORDER — ASPIRIN EC 81 MG PO TBEC: 81.0000 mg | DELAYED_RELEASE_TABLET | Freq: Every day | ORAL | 0 refills | Status: AC

## 2017-01-06 ENCOUNTER — Other Ambulatory Visit: Payer: Self-pay | Admitting: Family Medicine

## 2017-01-06 DIAGNOSIS — I10 Essential (primary) hypertension: Secondary | ICD-10-CM

## 2017-01-24 ENCOUNTER — Other Ambulatory Visit: Payer: Self-pay | Admitting: Family Medicine

## 2017-02-04 ENCOUNTER — Other Ambulatory Visit: Payer: Self-pay | Admitting: Family Medicine

## 2017-02-04 DIAGNOSIS — I10 Essential (primary) hypertension: Secondary | ICD-10-CM

## 2017-02-19 ENCOUNTER — Other Ambulatory Visit: Payer: Self-pay

## 2017-02-19 DIAGNOSIS — F209 Schizophrenia, unspecified: Secondary | ICD-10-CM

## 2017-02-19 NOTE — Telephone Encounter (Signed)
Got a fax from [facility] requesting a refill of this patient's Lorazepam 0.5mg .  Refill request was sent to Dr. Okey Dupre A. Manuella Ghazi for approval and submission.

## 2017-02-20 ENCOUNTER — Ambulatory Visit: Payer: Medicaid Other | Admitting: Podiatry

## 2017-02-20 ENCOUNTER — Other Ambulatory Visit: Payer: Self-pay

## 2017-02-20 DIAGNOSIS — F209 Schizophrenia, unspecified: Secondary | ICD-10-CM

## 2017-02-20 MED ORDER — LORAZEPAM 0.5 MG PO TABS: 0.5000 mg | ORAL_TABLET | Freq: Two times a day (BID) | ORAL | 2 refills | Status: DC | PRN

## 2017-02-21 ENCOUNTER — Telehealth: Payer: Self-pay

## 2017-02-21 NOTE — Telephone Encounter (Signed)
Pt has been notified Ativan is ready for pick up.

## 2017-02-26 ENCOUNTER — Other Ambulatory Visit: Payer: Self-pay | Admitting: Family Medicine

## 2017-02-26 DIAGNOSIS — I10 Essential (primary) hypertension: Secondary | ICD-10-CM

## 2017-03-06 ENCOUNTER — Encounter: Payer: Self-pay | Admitting: Podiatry

## 2017-03-06 ENCOUNTER — Ambulatory Visit (INDEPENDENT_AMBULATORY_CARE_PROVIDER_SITE_OTHER): Payer: Medicaid Other | Admitting: Podiatry

## 2017-03-06 DIAGNOSIS — Q828 Other specified congenital malformations of skin: Secondary | ICD-10-CM

## 2017-03-06 DIAGNOSIS — M2041 Other hammer toe(s) (acquired), right foot: Secondary | ICD-10-CM

## 2017-03-06 DIAGNOSIS — M79676 Pain in unspecified toe(s): Secondary | ICD-10-CM

## 2017-03-06 DIAGNOSIS — B351 Tinea unguium: Secondary | ICD-10-CM | POA: Diagnosis not present

## 2017-03-06 DIAGNOSIS — M2042 Other hammer toe(s) (acquired), left foot: Secondary | ICD-10-CM

## 2017-03-06 NOTE — Progress Notes (Signed)
Complaint:  Visit Type: Patient returns to my office for continued preventative foot care services. Complaint: Patient states" my nails have grown long and thick and become painful to walk and wear shoes" Patient has painful callus both feet.. The patient presents for preventative foot care services. No changes to ROS  Podiatric Exam: Vascular: dorsalis pedis and posterior tibial pulses are palpable bilateral. Capillary return is immediate. Temperature gradient is WNL. Skin turgor WNL  Sensorium: Diminished  Semmes Weinstein monofilament test. Normal tactile sensation bilaterally. Nail Exam: Pt has thick disfigured discolored nails with subungual debris noted bilateral entire nail hallux through fifth toenails Ulcer Exam: There is no evidence of ulcer or pre-ulcerative changes or infection. Orthopedic Exam: Muscle tone and strength are WNL. No limitations in general ROM. No crepitus or effusions noted. Foot type and digits show no abnormalities. Bony prominences are unremarkable. Skin:  Porokeratosis sub 5th met  B/L  [place] durum third toe left foot. No infection or ulcers  Diagnosis:  Onychomycosis, , Pain in right toe, pain in left toes  Treatment & Plan Procedures and Treatment: Consent by patient was obtained for treatment procedures. The patient understood the discussion of treatment and procedures well. All questions were answered thoroughly reviewed. Debridement of mycotic and hypertrophic toenails, 1 through 5 bilateral and clearing of subungual debris. No ulceration, no infection noted. Debride porokeratosis and HD.  DSD applied to third tie. Return Visit-Office Procedure: Patient instructed to return to the office for a follow up visit 3 months for continued evaluation and treatment.      DPM 

## 2017-03-19 ENCOUNTER — Other Ambulatory Visit: Payer: Self-pay | Admitting: Family Medicine

## 2017-03-24 ENCOUNTER — Other Ambulatory Visit: Payer: Self-pay | Admitting: Family Medicine

## 2017-03-26 ENCOUNTER — Telehealth: Payer: Self-pay | Admitting: Family Medicine

## 2017-03-26 NOTE — Telephone Encounter (Signed)
Christopher Martinez from [employer] has been faxing a request to change medication allegra to zyrtec or claritian due to insurance will not cover the [employer]

## 2017-03-28 NOTE — Telephone Encounter (Signed)
Fexofenadine has been prescribed by Dr. Lucita Lora, last time in 2016. Please schedule patient for an appointment for documentation and changing to a different covered medication.

## 2017-03-31 NOTE — Telephone Encounter (Signed)
They will return call to schedule appt

## 2017-04-02 ENCOUNTER — Ambulatory Visit: Payer: Self-pay | Admitting: Family Medicine

## 2017-04-07 ENCOUNTER — Encounter: Payer: Self-pay | Admitting: Family Medicine

## 2017-04-07 ENCOUNTER — Ambulatory Visit (INDEPENDENT_AMBULATORY_CARE_PROVIDER_SITE_OTHER): Payer: Medicaid Other | Admitting: Family Medicine

## 2017-04-07 VITALS — BP 110/72 | HR 86 | Ht 63.0 in | Wt 102.2 lb

## 2017-04-07 DIAGNOSIS — J3089 Other allergic rhinitis: Secondary | ICD-10-CM | POA: Diagnosis not present

## 2017-04-07 DIAGNOSIS — Z598 Other problems related to housing and economic circumstances: Secondary | ICD-10-CM

## 2017-04-07 DIAGNOSIS — Z23 Encounter for immunization: Secondary | ICD-10-CM | POA: Diagnosis not present

## 2017-04-07 DIAGNOSIS — K219 Gastro-esophageal reflux disease without esophagitis: Secondary | ICD-10-CM | POA: Diagnosis not present

## 2017-04-07 DIAGNOSIS — IMO0001 Reserved for inherently not codable concepts without codable children: Secondary | ICD-10-CM

## 2017-04-07 MED ORDER — OMEPRAZOLE 20 MG PO CPDR: DELAYED_RELEASE_CAPSULE | ORAL | 0 refills | Status: DC

## 2017-04-07 MED ORDER — CETIRIZINE HCL 10 MG PO CHEW: 10.0000 mg | CHEWABLE_TABLET | Freq: Every day | ORAL | 0 refills | Status: DC

## 2017-04-07 MED ORDER — CETIRIZINE HCL 10 MG PO CHEW: 10.0000 mg | CHEWABLE_TABLET | Freq: Every day | ORAL | 0 refills | Status: AC

## 2017-04-07 MED ORDER — RANITIDINE HCL 150 MG PO TABS: 150.0000 mg | ORAL_TABLET | Freq: Two times a day (BID) | ORAL | 3 refills | Status: AC

## 2017-04-07 NOTE — Progress Notes (Signed)
Name: Christopher Martinez   MRN: [id number]    DOB: [birth year]   Date:04/07/2017       Progress Note  Subjective  Chief Complaint  Chief Complaint  Patient presents with  . Advice Only    Pt states that he no longer feels he can stay in his currentl facility wonders if you can help with this   . Medication Refill  . Gastroesophageal Reflux    Pt states omperazole isn't working, taking zantac feels that helps more   . Immunizations    flu shot today     HPI  Patient's returns for follow up, his stomach is frequently upset, asks for PeptoBismol frequently, he has been on Omperazole for acid reflux which is not effective, he takes Zantac 150 mg twice daily which seems to work better. He feels nauseated but no vomiting, sometimes has pain in the epigastric region. He has no dysphagia, odynophagia, hematochezia, or other warning symptoms. He never had an endoscopy.   Patient is also here to switch his allergy medication from Fexofenadine to either Zyrtec or Claritin based on insurance approval. He denies any symptoms of coughing, itchy or watery eyes, or sinus infection but does complain of sneezing. He has been on Fexofenadine for a long time.   Patient is also here to discuss living on his own outside the group home [facility]. He has lived in the group home for 3 years, is having no issues as far as I can tell at the facility, but the staff member reports patint desiring to move out and live by himself. Patient is dependent on the staff for medication and meals and transportation. He is able to use the restroom independently, but needs assistance with bathing and dressing occasionally.    Past Medical History:  Diagnosis Date  . A-fib ([place])   . Acute renal failure ([place])   . Chest pain   . ED (erectile dysfunction)   . Elevated troponin   . Fall at home   . Femur fracture, right ([place])   . Frequent headaches   . GERD (gastroesophageal reflux disease)   . Hyperlipidemia   .  Hypertension   . Mild left ventricular systolic dysfunction   . Prostate cancer ([place])   . Pulmonary hypertension ([place])   . Schizophrenia ([place])   . Severe protein-calorie malnutrition ([place])     Past Surgical History:  Procedure Laterality Date  . orif right femur Right 07-29-2014    Family History  Problem Relation Age of Onset  . Kidney disease Neg Hx   . Prostate cancer Neg Hx     Social History   Social History  . Marital status: Single    Spouse name: N/A  . Number of children: N/A  . Years of education: N/A   Occupational History  . Not on file.   Social History Main Topics  . Smoking status: Current Every Day Smoker    Packs/day: 1.00    Years: 46.00    Types: Cigarettes  . Smokeless tobacco: Never Used     Comment: down to /5ppd per pt 03/13/15  . Alcohol use No  . Drug use: No  . Sexual activity: Not on file   Other Topics Concern  . Not on file   Social History Narrative  . No narrative on file     Current Outpatient Prescriptions:  .  acetaminophen (TYLENOL) 500 MG tablet, Take 1,000 mg by mouth every 6 (six) hours as needed., Disp: ,  Rfl:  .  ADVAIR DISKUS 250-50 MCG/DOSE AEPB, INHALE 1 PUFF BY MOUTH TWO TIMES A DAY *RINSE MOUTH AFTER USE*, Disp: 60 each, Rfl: 11 .  aspirin EC 81 MG tablet, Take 1 tablet (81 mg total) by mouth daily., Disp: 30 tablet, Rfl: 0 .  ASPIRIN LOW DOSE 81 MG chewable tablet, TAKE ONE TABLET BY MOUTH EVERY DAY, Disp: 30 tablet, Rfl: 10 .  DILT-XR 240 MG 24 hr capsule, TAKE ONE CAPSULE BY MOUTH EVERY DAY *DO NOT CRUSH*, Disp: 30 capsule, Rfl: 11 .  fexofenadine (ALLEGRA) 180 MG tablet, TAKE ONE TABLET BY MOUTH EVERY DAY, Disp: 10 tablet, Rfl: 0 .  LORazepam (ATIVAN) 0.5 MG tablet, Take 1 tablet (0.5 mg total) by mouth 2 (two) times daily as needed for anxiety., Disp: 60 tablet, Rfl: 2 .  magnesium hydroxide (MILK OF MAGNESIA) 800 MG/5ML suspension, Take 30 mLs by mouth every 6 (six) hours as needed for constipation., Disp: ,  Rfl:  .  metoprolol tartrate (LOPRESSOR) 100 MG tablet, TAKE ONE TABLET BY MOUTH TWO TIMES A DAY, Disp: 60 tablet, Rfl: 0 .  mometasone (ELOCON) 0.1 % cream, Apply 1 application topically daily., Disp: 15 g, Rfl: 0 .  omeprazole (PRILOSEC) 20 MG capsule, TAKE 1 CAPSULE BY MOUTH EVERY DAY *DO NOT CRUSH*, Disp: 30 capsule, Rfl: 0 .  ranitidine (ZANTAC) 150 MG tablet, Take 1 tablet (150 mg total) by mouth 2 (two) times daily., Disp: 60 tablet, Rfl: 3  Allergies  Allergen Reactions  . Thorazine [Chlorpromazine]      ROS  Please see history of present illness for complete discussion of ROS  Objective  Vitals:   04/07/17 1126  BP: 110/72  Pulse: 86  SpO2: 91%  Weight: 102 lb 3.2 oz (46.4 kg)  Height: 5\' 3"  (1.6 m)    Physical Exam  Constitutional: He is well-developed, well-nourished, and in no distress.  HENT:  Head: Normocephalic and atraumatic.  Cardiovascular: Normal rate, regular rhythm and normal heart sounds.   No murmur heard. Pulmonary/Chest: Effort normal and breath sounds normal. He has no wheezes.  Abdominal: Soft. Bowel sounds are normal. There is no tenderness.  Neurological: He is alert.  Psychiatric: Mood normal. He has a flat affect.  Nursing note and vitals reviewed.      Assessment & Plan  1. Flu vaccine need  - Flu Vaccine QUAD 36+ mos IM  2. Environmental and seasonal allergies Changed from fexofenadine to cetirizine 10 mg daily first environmental allergies - cetirizine (ZYRTEC) 10 MG chewable tablet; Chew 1 tablet (10 mg total) by mouth daily.  Dispense: 90 tablet; Refill: 0  3. Gastroesophageal reflux disease without esophagitis Persistent symptoms despite being on PPI and H2 blocker. Referral to gastroenterology for endoscopy - Ambulatory referral to Gastroenterology - ranitidine (ZANTAC) 150 MG tablet; Take 1 tablet (150 mg total) by mouth 2 (two) times daily.  Dispense: 60 tablet; Refill: 3 - omeprazole (PRILOSEC) 20 MG capsule; TAKE 1  CAPSULE BY MOUTH EVERY DAY *DO NOT CRUSH*  Dispense: 30 capsule; Refill: 0  4. Lives in independent group home Patient is not able to live independently outside of a group home setting, he will continue to reside for now at the current group home, paperwork completed  Meron Bocchino [facility] 04/07/2017 11:32 AM

## 2017-04-09 ENCOUNTER — Other Ambulatory Visit: Payer: Self-pay | Admitting: Family Medicine

## 2017-04-09 DIAGNOSIS — I10 Essential (primary) hypertension: Secondary | ICD-10-CM

## 2017-04-25 ENCOUNTER — Other Ambulatory Visit: Payer: Self-pay | Admitting: *Deleted

## 2017-05-06 ENCOUNTER — Ambulatory Visit: Payer: Medicaid Other | Admitting: Gastroenterology

## 2017-05-08 ENCOUNTER — Ambulatory Visit: Payer: Medicaid Other | Admitting: Gastroenterology

## 2017-05-08 ENCOUNTER — Encounter: Payer: Self-pay | Admitting: Gastroenterology

## 2017-05-16 ENCOUNTER — Other Ambulatory Visit: Payer: Self-pay | Admitting: Family Medicine

## 2017-05-16 DIAGNOSIS — K219 Gastro-esophageal reflux disease without esophagitis: Secondary | ICD-10-CM

## 2017-05-16 DIAGNOSIS — I10 Essential (primary) hypertension: Secondary | ICD-10-CM

## 2017-05-19 ENCOUNTER — Other Ambulatory Visit: Payer: Self-pay

## 2017-05-19 ENCOUNTER — Ambulatory Visit: Payer: Medicaid Other | Admitting: Gastroenterology

## 2017-05-20 ENCOUNTER — Ambulatory Visit: Payer: Medicaid Other | Admitting: Internal Medicine

## 2017-05-20 NOTE — Progress Notes (Deleted)
[facility] Consultation    Synopsis: Patient is a 61 year old male smoker, with a history of A-fib, kidney disease, CHF, hypertension, prostate cancer and hyperlipidemia, who is a resident at a group home. We are following him for COPD and possible interstitial lung disease, he also has a history of smoking and pulmonary hypertension.    Assessment and Plan:  -Dyspnea and respiratory failure. Possibly secondary to pulmonary and cardiac disease. I suspect he has a mixture of moderate COPD as well as some degree of interstitial lung disease. --The best thing that he can do for his health at this time is to quit smoking.    -COPD-emphysema. Will start advair 100 bid.  Continue nocturnal oxygen.  Discussed smoking cessation.   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 Given comorbidities, current smoking, poor functional status and cognitive dysfunction I do not think that he would be a good candidate for therapy for this, therefore will defer CT scanning at this time.   -Nicotine abuse. Discussed smoking cessation strategies today, though not sure that the patient is ready to quit at this time.  -Essential hypertension.  -Atrial fibrillation on anticoagulation.  -Pulmonary hypertension. Pulmonary systolic pressure of 60 mmHg noted on echocardiogram from 11/30/2013. Suspect that this is a either group 2 and/or group 3, secondary to cardiac and/or respiratory disease.    Date: 05/20/2017  MRN# [id number] Christopher Martinez [birth year]  Referring Physician: Ashok Norris  Christopher Martinez is a 61 y.o. old male seen in consultation for chief complaint of:    No chief complaint on file.   Subjective   He is still smoking about a pack per day, not sure that he is ready to quit. He tells me that he has been using his inhalers as prescribed.    Review of testing: -Chest x-ray 03/13/2015: Mild hyperinflation, bibasilar interstitial changes. -Pulmonary function test  04/18/2015: 10 shows moderate obstructive lung disease with an FEV1 of 64% of predicted, there is no change with proper dilator therapy. Expiratory reserve volume was elevated at 329%, suggesting air trapping, DLCO was reduced at 44%. -6 minute walk test, mild dyspnea, but pt limited by leg pain and poor gait.  -Overnight oximetry was performed on 03/15/2015: The patient spent 1 hour 32 minutes below 80, oxygen saturation 88% or less; it was recommended that he be put on 2 L of oxygen at night. -His baseline O2 sat was 95% and HR was 87 at rest at RA. He was ambulated for 700 feet with minimal dyspnea, sat was 94% and 108 at the end of the walk. -Echocardiogram results from 11/30/2013 reviewed. Ejection fraction was 55%, mild mitral regurgitation was noted. Moderate tricuspid regurgitation was noted, RV systolic pressure was 60 mmHg. There is no recent CXR , or PFT for review.    Medication:   Current Outpatient Rx  . Order #: [id number] Class: Historical Med  . Order #: [id number] Class: Normal  . Order #: [id number] Class: Normal  . Order #: [id number] Class: Normal  . Order #: [id number] Class: Normal  . Order #: [id number] Class: Normal  . Order #: [id number] Class: Print  . Order #: [id number] Class: Historical Med  . Order #: [id number] Class: Normal  . Order #: [id number] Class: Normal  . Order #: [id number] Class: Normal  . Order #: [id number] Class: Normal      Allergies:  Thorazine [chlorpromazine]  Review of Systems: Gen:  Denies  fever, sweats, chills HEENT: Denies blurred vision, Cvc:  No dizziness, chest pain. Resp:   Denies cough or sputum porduction,  shortness of breath Gi: Denies swallowing difficulty, stomach pain. Gu:  Denies bladder incontinence, burning urine Ext:   No Joint pain, stiffness. Skin: No skin rash,  hives Endoc:  No polyuria, polydipsia. Psych: No depression, insomnia. Other:  All other systems were reviewed with the patient and were negative other that what is  mentioned in the HPI.   Physical Examination:   VS: There were no vitals taken for this visit.  General Appearance: No distress  Neuro:without focal findings,  speech normal,  HEENT: PERRLA, EOM intact.   Pulmonary: Bibasilar crackles.  CardiovascularNormal S1,S2.  No m/r/g.   Abdomen: Benign, Soft, non-tender. Renal:  No costovertebral tenderness  GU:  No performed at this time. Endoc: No evident thyromegaly, no signs of acromegaly. Skin:   warm, no rashes, no ecchymosis  Extremities: normal, no cyanosis, clubbing.  Other findings:    LABORATORY PANEL:   CBC No results for input(s): WBC, HGB, HCT, PLT in the last 168 hours. ------------------------------------------------------------------------------------------------------------------  Chemistries  No results for input(s): NA, K, CL, CO2, GLUCOSE, BUN, CREATININE, CALCIUM, MG, AST, ALT, ALKPHOS, BILITOT in the last 168 hours.  Invalid input(s): GFRCGP ------------------------------------------------------------------------------------------------------------------    Thank  you for the consultation and for allowing [facility] to assist in the care of your patient. Our recommendations are noted above.  Please contact us if we can be of further service.   Marda Stalker, MD.  Board Certified in Internal Medicine, Pulmonary Medicine, [place], and Sleep Medicine.  [facility] Office Number: [phone]  Patricia Pesa, M.D.  Cheral Marker, M.D

## 2017-05-23 ENCOUNTER — Other Ambulatory Visit: Payer: Self-pay | Admitting: Family Medicine

## 2017-05-23 DIAGNOSIS — K219 Gastro-esophageal reflux disease without esophagitis: Secondary | ICD-10-CM

## 2017-05-28 ENCOUNTER — Inpatient Hospital Stay: Payer: Medicaid Other | Attending: Radiation Oncology

## 2017-05-29 ENCOUNTER — Telehealth: Payer: Self-pay | Admitting: Gastroenterology

## 2017-05-29 NOTE — Telephone Encounter (Signed)
Called to confirm appt. Home stated that patient fell and he would not be in. They will call back to reschedule this appt

## 2017-05-30 ENCOUNTER — Ambulatory Visit: Payer: Medicaid Other | Admitting: Gastroenterology

## 2017-06-02 ENCOUNTER — Ambulatory Visit: Payer: Medicaid Other | Admitting: Internal Medicine

## 2017-06-03 ENCOUNTER — Ambulatory Visit: Payer: Medicaid Other | Admitting: Family Medicine

## 2017-06-04 ENCOUNTER — Ambulatory Visit: Payer: Medicaid Other | Admitting: Radiation Oncology

## 2017-06-05 ENCOUNTER — Ambulatory Visit: Payer: Medicaid Other | Admitting: Podiatry

## 2017-06-05 ENCOUNTER — Other Ambulatory Visit: Payer: Self-pay

## 2017-06-05 DIAGNOSIS — F209 Schizophrenia, unspecified: Secondary | ICD-10-CM

## 2017-06-08 MED ORDER — LORAZEPAM 0.5 MG PO TABS: 0.5000 mg | ORAL_TABLET | Freq: Two times a day (BID) | ORAL | 2 refills | Status: AC | PRN

## 2017-07-10 ENCOUNTER — Telehealth: Payer: Self-pay

## 2017-07-10 NOTE — Telephone Encounter (Signed)
Copied from [place] [id number]. Topic: Medical Record Request - Provider/Facility Request >> Jul 10, 2017  1:21 PM [name], [place] wrote: Patient Name/DOB/MRN #:07/30/1955 [id number] Requestor Needs [facility]  pediatric and adult  Call Back #: [phone] Information Requested: any recent oxygent testing ,any notes saying that he is still benefits from using oxygen    Route to [facility] for [place] clinics. For all other clinics, route to the clinic's PEC Pool.

## 2017-07-10 NOTE — Telephone Encounter (Signed)
We do not have any oxygen testing on file, his oxygen saturation during this visit's have been in the low 90s usually, he has history of A. fib and COPD and also follows up with cardiology, the agency can check with them for any results

## 2017-07-11 NOTE — Telephone Encounter (Signed)
Spoke with Amy at [facility] and informed her we do not have any oxygen testing on file and to try to reach out to Dr. Clayborn Bigness patient cardiologist. Amy states she would try to see if they have any notes but asked if Dr. Manuella Ghazi would sign a script on oxygen and follow up with a appt if she was unable to reach cardiology with a solution?

## 2017-07-14 NOTE — Telephone Encounter (Signed)
I will not sign any prescription for oxygen unless we know the diagnosis and the demonstrated need for supplemental oxygen. If we have previous notes from Dr. Derrek Monaco, we can review and/or refer patient to pulmonology.

## 2017-07-28 ENCOUNTER — Other Ambulatory Visit: Payer: Self-pay

## 2017-07-28 DIAGNOSIS — F209 Schizophrenia, unspecified: Secondary | ICD-10-CM

## 2017-08-29 DEATH — deceased
